# Patient Record
Sex: Female | Born: 1980 | Race: White | Hispanic: No | Marital: Single | Smoking: Never smoker
Health system: Southern US, Community
[De-identification: ages and names within clinical notes are randomized; demographics above are authoritative.]

## PROBLEM LIST (undated history)

## (undated) DIAGNOSIS — F419 Anxiety disorder, unspecified: Secondary | ICD-10-CM

## (undated) DIAGNOSIS — F329 Major depressive disorder, single episode, unspecified: Secondary | ICD-10-CM

## (undated) DIAGNOSIS — F988 Other specified behavioral and emotional disorders with onset usually occurring in childhood and adolescence: Secondary | ICD-10-CM

## (undated) DIAGNOSIS — F32A Depression, unspecified: Secondary | ICD-10-CM

## (undated) HISTORY — DX: Other specified behavioral and emotional disorders with onset usually occurring in childhood and adolescence: F98.8

## (undated) HISTORY — DX: Major depressive disorder, single episode, unspecified: F32.9

## (undated) HISTORY — DX: Depression, unspecified: F32.A

---

## 2004-12-11 DIAGNOSIS — F1590 Other stimulant use, unspecified, uncomplicated: Secondary | ICD-10-CM

## 2004-12-11 HISTORY — DX: Other stimulant use, unspecified, uncomplicated: F15.90

## 2013-05-21 ENCOUNTER — Other Ambulatory Visit: Payer: Self-pay | Admitting: Gastroenterology

## 2013-05-21 ENCOUNTER — Ambulatory Visit
Admission: RE | Admit: 2013-05-21 | Discharge: 2013-05-21 | Disposition: A | Discharge status: Home / Self Care | Payer: Medicare Other | Source: Ambulatory Visit | Attending: Gastroenterology | Admitting: Gastroenterology

## 2013-05-21 DIAGNOSIS — K5900 Constipation, unspecified: Secondary | ICD-10-CM

## 2013-08-18 ENCOUNTER — Ambulatory Visit (INDEPENDENT_AMBULATORY_CARE_PROVIDER_SITE_OTHER): Payer: Medicare Other | Admitting: Emergency Medicine

## 2013-08-18 VITALS — BP 112/60 | HR 87 | Temp 97.8°F | Resp 18 | Ht 68.5 in | Wt 153.0 lb

## 2013-08-18 DIAGNOSIS — F3289 Other specified depressive episodes: Secondary | ICD-10-CM

## 2013-08-18 DIAGNOSIS — F329 Major depressive disorder, single episode, unspecified: Secondary | ICD-10-CM

## 2013-08-18 DIAGNOSIS — F988 Other specified behavioral and emotional disorders with onset usually occurring in childhood and adolescence: Secondary | ICD-10-CM

## 2013-08-18 DIAGNOSIS — F32A Depression, unspecified: Secondary | ICD-10-CM

## 2013-08-18 MED ORDER — ARIPIPRAZOLE 2 MG PO TABS: 2.0000 mg | ORAL_TABLET | Freq: Every day | ORAL | Status: AC

## 2013-08-18 MED ORDER — AMPHETAMINE-DEXTROAMPHETAMINE 30 MG PO TABS: 30.0000 mg | ORAL_TABLET | Freq: Every day | ORAL | Status: DC

## 2013-08-18 NOTE — Progress Notes (Signed)
[facility] [address] [place] [address] [phone]  Date:  08/18/2013   Name:  Brianna Reese   DOB:  July 06, 1981   MRN:  [id number]  PCP:  No primary provider on file.    Chief Complaint: Medication Refill   History of Present Illness:  Brianna Reese is a 32 y.o. very pleasant female patient who presents with the following:  Just moved here from [place] and needs medication.  She is attending school.  Is out of medications.  Suffers chronic depression and ADD.  She provided evidence via [facility] records of her medication history.   No improvement with over the counter medications or other home remedies. Denies other complaint or health concern today.   There are no active problems to display for this patient.   Past Medical History  Diagnosis Date  . ADD (attention deficit disorder)     History reviewed. No pertinent past surgical history.  History  Substance Use Topics  . Smoking status: Never Smoker   . Smokeless tobacco: Not on file  . Alcohol Use: No    History reviewed. No pertinent family history.  No Known Allergies  Medication list has been reviewed and updated.  No current outpatient prescriptions on file prior to visit.   No current facility-administered medications on file prior to visit.    Review of Systems:  As per HPI, otherwise negative.   Physical Examination: Filed Vitals:   08/18/13 1644  BP: 112/60  Pulse: 87  Temp: 97.8 F (36.6 C)  Resp: 18   Filed Vitals:   08/18/13 1644  Height: 5' 8.5" (1.74 m)  Weight: 153 lb (69.4 kg)   Body mass index is 22.92 kg/(m^2). Ideal Body Weight: Weight in (lb) to have BMI = 25: 166.5  GEN: WDWN, NAD, Non-toxic, A & O x 3 HEENT: Atraumatic, Normocephalic. Neck supple. No masses, No LAD. Ears and Nose: No external deformity. CV: RRR, No M/G/R. No JVD. No thrill. No extra heart sounds. PULM: CTA B, no wheezes, crackles, rhonchi. No retractions. No resp. distress. No  accessory muscle use. ABD: S, NT, ND, +BS. No rebound. No HSM. EXTR: No c/c/e NEURO Normal gait.  PSYCH: Normally interactive. Conversant. Not depressed or anxious appearing.  Calm demeanor.    Assessment and Plan: ADD and depression Bring records One month supply medication Signed,  Phillips Odor, MD

## 2013-08-18 NOTE — Patient Instructions (Addendum)
Attention Deficit Hyperactivity Disorder Attention deficit hyperactivity disorder (ADHD) is a problem with behavior issues based on the way the brain functions (neurobehavioral disorder). It is a common reason for behavior and academic problems in school. CAUSES  The cause of ADHD is unknown in most cases. It may run in families. It sometimes can be associated with learning disabilities and other behavioral problems. SYMPTOMS  There are 3 types of ADHD. The 3 types and some of the symptoms include:  Inattentive  Gets bored or distracted easily.  Loses or forgets things. Forgets to hand in homework.  Has trouble organizing or completing tasks.  Difficulty staying on task.  An inability to organize daily tasks and school work.  Leaving projects, chores, or homework unfinished.  Trouble paying attention or responding to details. Careless mistakes.  Difficulty following directions. Often seems like is not listening.  Dislikes activities that require sustained attention (like chores or homework).  Hyperactive-impulsive  Feels like it is impossible to sit still or stay in a seat. Fidgeting with hands and feet.  Trouble waiting turn.  Talking too much or out of turn. Interruptive.  Speaks or acts impulsively.  Aggressive, disruptive behavior.  Constantly busy or on the go, noisy.  Combined  Has symptoms of both of the above. Often children with ADHD feel discouraged about themselves and with school. They often perform well below their abilities in school. These symptoms can cause problems in home, school, and in relationships with peers. As children get older, the excess motor activities can calm down, but the problems with paying attention and staying organized persist. Most children do not outgrow ADHD but with good treatment can learn to cope with the symptoms. DIAGNOSIS  When ADHD is suspected, the diagnosis should be made by professionals trained in ADHD.  Diagnosis will  include:  Ruling out other reasons for the child's behavior.  The caregivers will check with the child's school and check their medical records.  They will talk to teachers and parents.  Behavior rating scales for the child will be filled out by those dealing with the child on a daily basis. A diagnosis is made only after all information has been considered. TREATMENT  Treatment usually includes behavioral treatment often along with medicines. It may include stimulant medicines. The stimulant medicines decrease impulsivity and hyperactivity and increase attention. Other medicines used include antidepressants and certain blood pressure medicines. Most experts agree that treatment for ADHD should address all aspects of the child's functioning. Treatment should not be limited to the use of medicines alone. Treatment should include structured classroom management. The parents must receive education to address rewarding good behavior, discipline, and limit-setting. Tutoring or behavioral therapy or both should be available for the child. If untreated, the disorder can have long-term serious effects into adolescence and adulthood. HOME CARE INSTRUCTIONS   Often with ADHD there is a lot of frustration among the family in dealing with the illness. There is often blame and anger that is not warranted. This is a life long illness. There is no way to prevent ADHD. In many cases, because the problem affects the family as a whole, the entire family may need help. A therapist can help the family find better ways to handle the disruptive behaviors and promote change. If the child is young, most of the therapist's work is with the parents. Parents will learn techniques for coping with and improving their child's behavior. Sometimes only the child with the ADHD needs counseling. Your caregivers can help   you make these decisions.  Children with ADHD may need help in organizing. Some helpful tips include:  Keep  routines the same every day from wake-up time to bedtime. Schedule everything. This includes homework and playtime. This should include outdoor and indoor recreation. Keep the schedule on the refrigerator or a bulletin board where it is frequently seen. Mark schedule changes as far in advance as possible.  Have a place for everything and keep everything in its place. This includes clothing, backpacks, and school supplies.  Encourage writing down assignments and bringing home needed books.  Offer your child a well-balanced diet. Breakfast is especially important for school performance. Children should avoid drinks with caffeine including:  Soft drinks.  Coffee.  Tea.  However, some older children (adolescents) may find these drinks helpful in improving their attention.  Children with ADHD need consistent rules that they can understand and follow. If rules are followed, give small rewards. Children with ADHD often receive, and expect, criticism. Look for good behavior and praise it. Set realistic goals. Give clear instructions. Look for activities that can foster success and self-esteem. Make time for pleasant activities with your child. Give lots of affection.  Parents are their children's greatest advocates. Learn as much as possible about ADHD. This helps you become a stronger and better advocate for your child. It also helps you educate your child's teachers and instructors if they feel inadequate in these areas. Parent support groups are often helpful. A national group with local chapters is called CHADD (Children and Adults with Attention Deficit Hyperactivity Disorder). PROGNOSIS  There is no cure for ADHD. Children with the disorder seldom outgrow it. Many find adaptive ways to accommodate the ADHD as they mature. SEEK MEDICAL CARE IF:  Your child has repeated muscle twitches, cough or speech outbursts.  Your child has sleep problems.  Your child has a marked loss of  appetite.  Your child develops depression.  Your child has new or worsening behavioral problems.  Your child develops dizziness.  Your child has a racing heart.  Your child has stomach pains.  Your child develops headaches. Document Released: 11/17/2002 Document Revised: 02/19/2012 Document Reviewed: 06/29/2008 ExitCare Patient Information 2014 [employer].  

## 2013-08-20 ENCOUNTER — Telehealth: Payer: Self-pay | Admitting: Family Medicine

## 2013-08-20 NOTE — Telephone Encounter (Signed)
Faxed records release to family Dr. in [place]. Confirmation received. Eileen Stanford

## 2013-09-01 ENCOUNTER — Ambulatory Visit (INDEPENDENT_AMBULATORY_CARE_PROVIDER_SITE_OTHER): Payer: Medicare Other | Admitting: Internal Medicine

## 2013-09-01 VITALS — BP 110/60 | HR 82 | Temp 98.2°F | Resp 16 | Ht 69.0 in | Wt 153.8 lb

## 2013-09-01 DIAGNOSIS — Z7189 Other specified counseling: Secondary | ICD-10-CM

## 2013-09-01 DIAGNOSIS — Z7185 Encounter for immunization safety counseling: Secondary | ICD-10-CM

## 2013-09-01 NOTE — Patient Instructions (Signed)
Will follow up with immunization if not immune.

## 2013-09-01 NOTE — Progress Notes (Signed)
  Subjective:    Patient ID: Brianna Reese, female    DOB: December 15, 1980, 32 y.o.   MRN: [id number]  HPI nees to have her mmr status or immunization for a scholarship she is to receive to go back to school >unable to get records from previous immunizations.    Review of Systems  All other systems reviewed and are negative.       Objective:   Physical Exam  Nursing note and vitals reviewed. Constitutional: She is oriented to person, place, and time. She appears well-developed and well-nourished.  HENT:  Head: Normocephalic and atraumatic.  Nose: Nose normal.  Eyes: Conjunctivae and EOM are normal. Pupils are equal, round, and reactive to light.  Neck: Normal range of motion. Neck supple.  Cardiovascular: Normal rate and regular rhythm.   Pulmonary/Chest: Effort normal.  Abdominal: Soft.  Musculoskeletal: Normal range of motion.  Neurological: She is alert and oriented to person, place, and time.  Skin: Skin is warm and dry.  Psychiatric: She has a normal mood and affect. Her behavior is normal. Thought content normal.          Assessment & Plan:  Will check mmr immune titer and then immunize if necessayr

## 2013-09-01 NOTE — Addendum Note (Signed)
Addended by: Sheldon Silvan on: 09/01/2013 08:32 PM   Modules accepted: Level of Service

## 2013-09-03 ENCOUNTER — Telehealth: Payer: Self-pay

## 2013-09-03 NOTE — Telephone Encounter (Signed)
Can someone please review these for me. Thanks

## 2013-09-03 NOTE — Telephone Encounter (Signed)
Please let know that titer shows she is immune MMR and does not need a booster

## 2013-09-03 NOTE — Telephone Encounter (Signed)
LMOM for pt to call me back.

## 2013-09-03 NOTE — Telephone Encounter (Signed)
Dr Dareen Piano only gives one month until she can establish with a provider, he will not refill. She will need office visit. Will advise when she calls back about the Labs.

## 2013-09-03 NOTE — Telephone Encounter (Signed)
Pt is also needing a refill on adderall

## 2013-09-04 NOTE — Telephone Encounter (Signed)
Called her to advise her to come in to establish care Dr Dareen Piano does not do this. Left message for her to call me back.

## 2013-09-04 NOTE — Telephone Encounter (Signed)
PT REQUESTING ADDERALL REFILL    BEST PHONE [phone]

## 2013-09-04 NOTE — Telephone Encounter (Signed)
Patient advised. She states she will come in to establish care. She states she had medical records sent, have we gotten these?

## 2013-09-05 ENCOUNTER — Ambulatory Visit (INDEPENDENT_AMBULATORY_CARE_PROVIDER_SITE_OTHER): Payer: Medicare Other | Admitting: Internal Medicine

## 2013-09-05 VITALS — BP 100/62 | HR 87 | Temp 98.1°F | Resp 18 | Ht 68.5 in | Wt 155.0 lb

## 2013-09-05 DIAGNOSIS — F329 Major depressive disorder, single episode, unspecified: Secondary | ICD-10-CM

## 2013-09-05 DIAGNOSIS — F32A Depression, unspecified: Secondary | ICD-10-CM

## 2013-09-05 DIAGNOSIS — F988 Other specified behavioral and emotional disorders with onset usually occurring in childhood and adolescence: Secondary | ICD-10-CM

## 2013-09-05 MED ORDER — AMPHETAMINE-DEXTROAMPHETAMINE 30 MG PO TABS: 30.0000 mg | ORAL_TABLET | Freq: Two times a day (BID) | ORAL | Status: DC

## 2013-09-05 NOTE — Progress Notes (Signed)
  Subjective:    Patient ID: Brianna Reese, female    DOB: 12/20/1980, 32 y.o.   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 Why//was losing sense of self//Mom depressed as well/had a bad relationship No current relationship   Review of Systems No illnesses/no other medications No weight loss No chest pain or palpitations No shortness of breath No endocrine disorders No headaches    Objective:   Physical Exam BP 100/62  Pulse 87  Temp(Src) 98.1 F (36.7 C) (Oral)  Resp 18  Ht 5' 8.5" (1.74 m)  Wt 155 lb (70.308 kg)  BMI 23.22 kg/m2  SpO2 100%  LMP 08/17/2013 No thyromegaly Heart regular Neurological intact Mood good Affect appropriate Thought content normal   Discussed ADD and relationships Discussed cognitive behavioral therapy 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 Marliss Czar for counseling Meds ordered this encounter  Medications  . amphetamine-dextroamphetamine (ADDERALL) 30 MG tablet    Sig: Take 1 tablet (30 mg total) by mouth 2 (two) times daily.    Dispense:  60 tablet    Refill:  0  .  amphetamine-dextroamphetamine (ADDERALL) 30 MG tablet    Sig: Take 1 tablet (30 mg total) by mouth 2 (two) times daily.    Dispense:  60 tablet    Refill:  0         10/05/2013   . amphetamine-dextroamphetamine (ADDERALL) 30 MG tablet    Sig: Take 1 tablet (30 mg total) by mouth 2 (two) times daily.    Dispense:  60 tablet    Refill:  0        11/06/19 14   She has enough Abilify Followup 3 months

## 2013-09-07 DIAGNOSIS — F988 Other specified behavioral and emotional disorders with onset usually occurring in childhood and adolescence: Secondary | ICD-10-CM | POA: Insufficient documentation

## 2013-09-07 DIAGNOSIS — F329 Major depressive disorder, single episode, unspecified: Secondary | ICD-10-CM | POA: Insufficient documentation

## 2013-09-07 DIAGNOSIS — F32A Depression, unspecified: Secondary | ICD-10-CM | POA: Insufficient documentation

## 2013-09-10 NOTE — Progress Notes (Signed)
Appt made for 12/17 at 3:30.

## 2013-11-26 ENCOUNTER — Ambulatory Visit (INDEPENDENT_AMBULATORY_CARE_PROVIDER_SITE_OTHER): Payer: Medicare Other | Admitting: Internal Medicine

## 2013-11-26 ENCOUNTER — Encounter: Payer: Self-pay | Admitting: Internal Medicine

## 2013-11-26 VITALS — BP 108/70 | HR 98 | Temp 98.0°F | Resp 16 | Ht 68.0 in | Wt 149.8 lb

## 2013-11-26 DIAGNOSIS — F988 Other specified behavioral and emotional disorders with onset usually occurring in childhood and adolescence: Secondary | ICD-10-CM

## 2013-11-26 DIAGNOSIS — F32A Depression, unspecified: Secondary | ICD-10-CM

## 2013-11-26 DIAGNOSIS — F329 Major depressive disorder, single episode, unspecified: Secondary | ICD-10-CM

## 2013-11-26 MED ORDER — AMPHETAMINE-DEXTROAMPHETAMINE 30 MG PO TABS: 30.0000 mg | ORAL_TABLET | Freq: Two times a day (BID) | ORAL | Status: AC

## 2013-11-26 MED ORDER — AMPHETAMINE-DEXTROAMPHET ER 30 MG PO CP24: 30.0000 mg | ORAL_CAPSULE | Freq: Two times a day (BID) | ORAL | Status: AC

## 2013-11-26 NOTE — Progress Notes (Addendum)
   Subjective:    Patient ID: Brianna Reese, female    DOB: [birth year], 32 y.o.   MRN: [id number]  HPI Patient reports today for follow up of depression and ADD. Had a good semester. Feels like medications are working well. Does not wish to change anything at this time. Wishes to schedule complete physical for next visit.  Unaware of [employer] eligibility Had panic during exams//see meds list--was not talkative about this care(cloNIDine (CATAPRES) 0.1 MG tablet/clonazePAM (KLONOPIN) 0.5 MG tablet) Denies problems now   Review of Systems noncontrib    Objective:   Physical Exam  Nursing note and vitals reviewed. Constitutional: She is oriented to person, place, and time. She appears well-developed and well-nourished.  Pulmonary/Chest: Effort normal.  Neurological: She is alert and oriented to person, place, and time.  Psychiatric: She has a normal mood and affect. Her behavior is normal. Judgment and thought content normal.      Assessment & Plan:  Attention deficit disorder without mention of hyperactivity  Depression  Asked patient to request her records from [place]. Return in 3 months for complete physical exam. Gave name of A Mitchum again [facility] to be run  Meds ordered this encounter  Medications  . amphetamine-dextroamphetamine (ADDERALL) 30 MG tablet    Sig: Take 1 tablet (30 mg total) by mouth 2 (two) times daily. For 30d after signing date    Dispense:  60 tablet    Refill:  0  . amphetamine-dextroamphetamine (ADDERALL) 30 MG tablet    Sig: Take 1 tablet (30 mg total) by mouth 2 (two) times daily.    Dispense:  60 tablet    Refill:  0  . amphetamine-dextroamphetamine (ADDERALL XR) 30 MG 24 hr capsule    Sig: Take 1 capsule (30 mg total) by mouth 2 (two) times daily. For 60 days after signing date    Dispense:  60 capsule    Refill:  0    I have completed the patient encounter in its entirety as documented by FNP Leone Payor, with editing by me where  necessary. Brianna Reese P. Merla Riches, M.D.   Addendum-11/30/13 Because of her lack of attention to providing medical records I investigated the [place] control substances reporting system and discovered that she has obtained other stimulant medications at the same time has not prescriptions from at least 2 other providers: [facility]=[place] and [facility]-[place] As a result all discontinued prescribing any medications for her as it seems most appropriate that she have neuropsychiatric care

## 2013-12-01 ENCOUNTER — Telehealth: Payer: Self-pay | Admitting: Radiology

## 2013-12-01 NOTE — Telephone Encounter (Signed)
Brianna Reese is working on trying to get this cancelled.

## 2013-12-01 NOTE — Telephone Encounter (Signed)
Pharmacy opens at 9am, will call to cancel then.

## 2013-12-30 ENCOUNTER — Telehealth: Payer: Self-pay

## 2013-12-30 NOTE — Telephone Encounter (Signed)
According to Dr Doolittle's last OV it looks like he knows about her filling multiple Rx from different providers at different pharmacies he feels that she should be seen [facility].  I have called and left a message for the patient to return our call and I am happy to talk with her.

## 2013-12-30 NOTE — Telephone Encounter (Signed)
Leatrice JewelsAleisha, pharmacist at [facility], called to report pt has been getting Adderall from several different providers and multiple pharmacies and wanted to know if we were aware and still want her to fill the current Rx for pt written 11/26/13 and due to fill now. Other providers include Dr Benetta [place]ia [place]nodi in [place] and Dr Elliot Gault[place]inta Mojeed in [place]. I advised pharmacist to not fill the Rx until she hears back from us. Can someone please run the controlled subst report and advise, and we can forward info to Dr Merla Richesoolittle.

## 2014-01-01 NOTE — Telephone Encounter (Signed)
Dr Doolittle; FYI 

## 2014-01-02 NOTE — Telephone Encounter (Signed)
Call that pharmacist to cancel filling any further prescriptions from me

## 2014-01-05 NOTE — Telephone Encounter (Signed)
Called [facility] and told them, per dr doolittle's note, to not fill anymore rx's from him for this pt. They are aware.

## 2014-03-04 ENCOUNTER — Ambulatory Visit: Payer: Medicare Other | Admitting: Internal Medicine

## 2014-05-28 IMAGING — CR DG ABDOMEN 2V
2 series · 2 of 2 positions shown · non-contrast
Comparison: None.

CLINICAL DATA: Left upper quadrant pain, constipation for 2 years

ABDOMEN - 2 VIEW

[w abdomen upright]
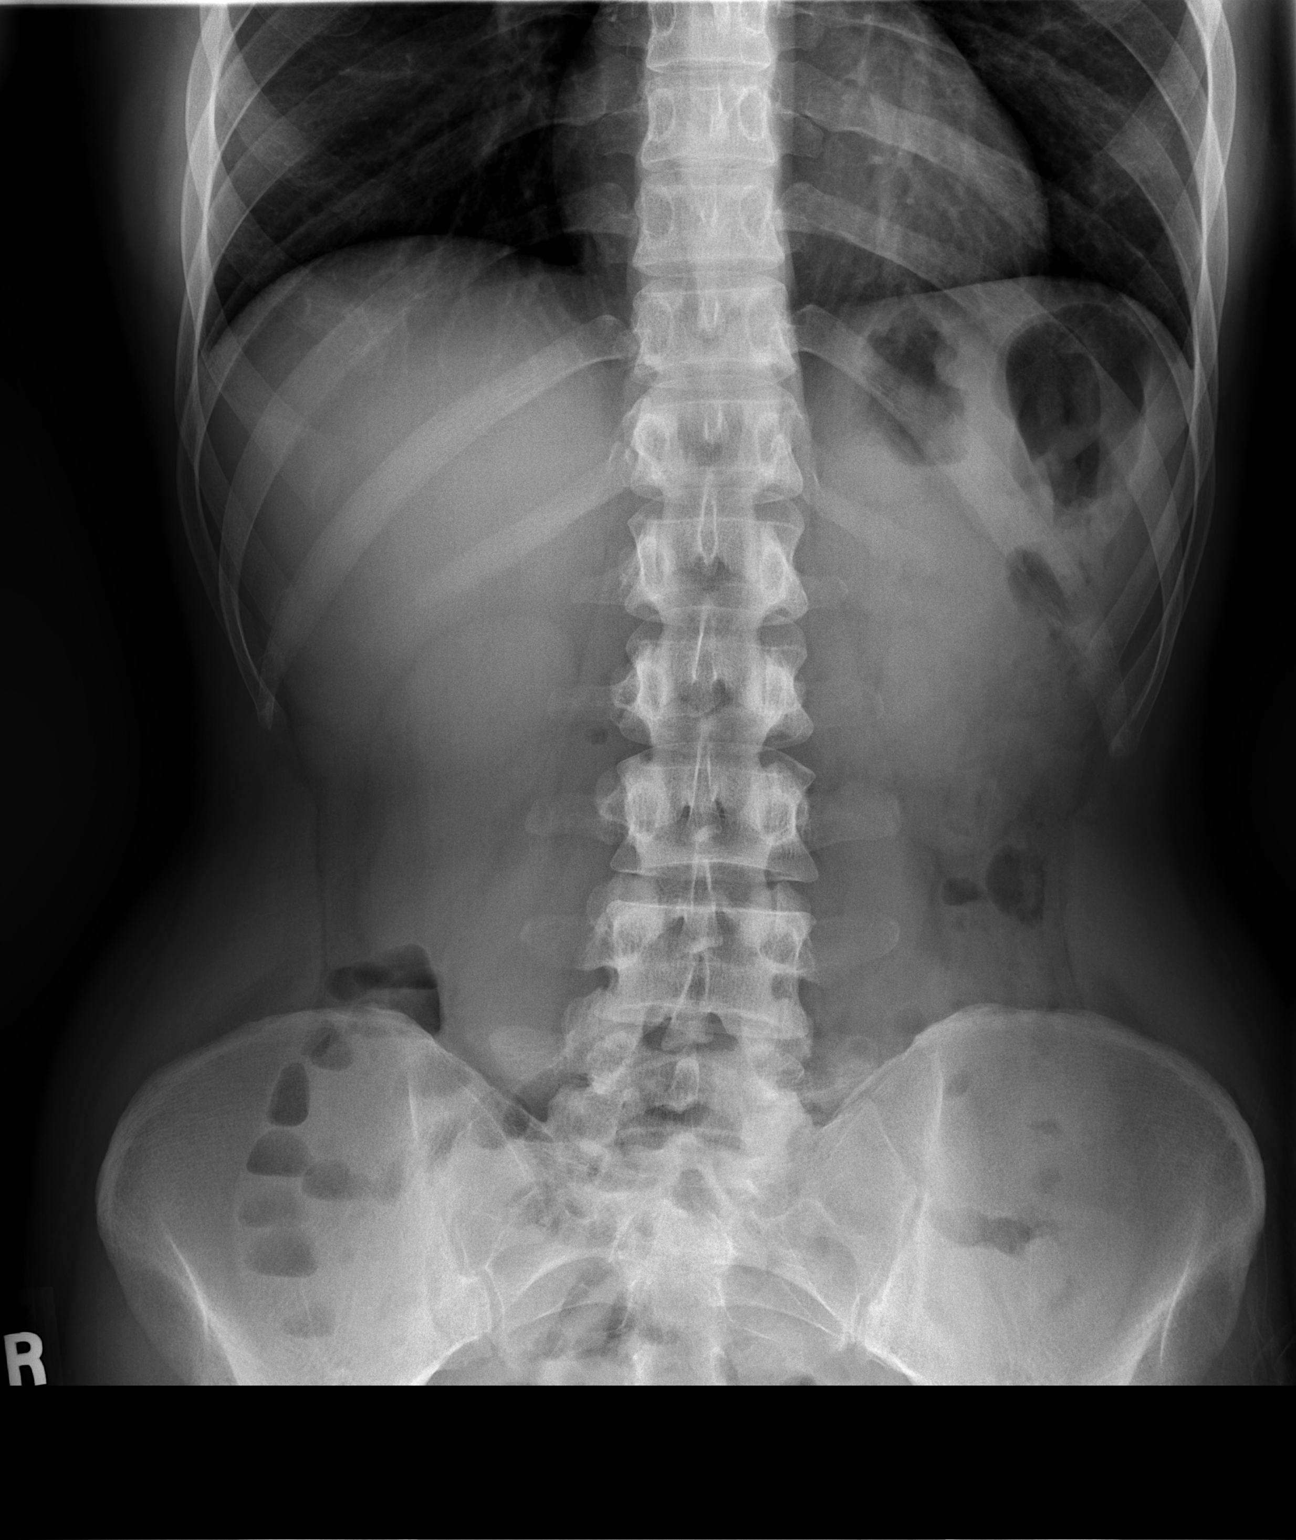

[t abdomen supine]
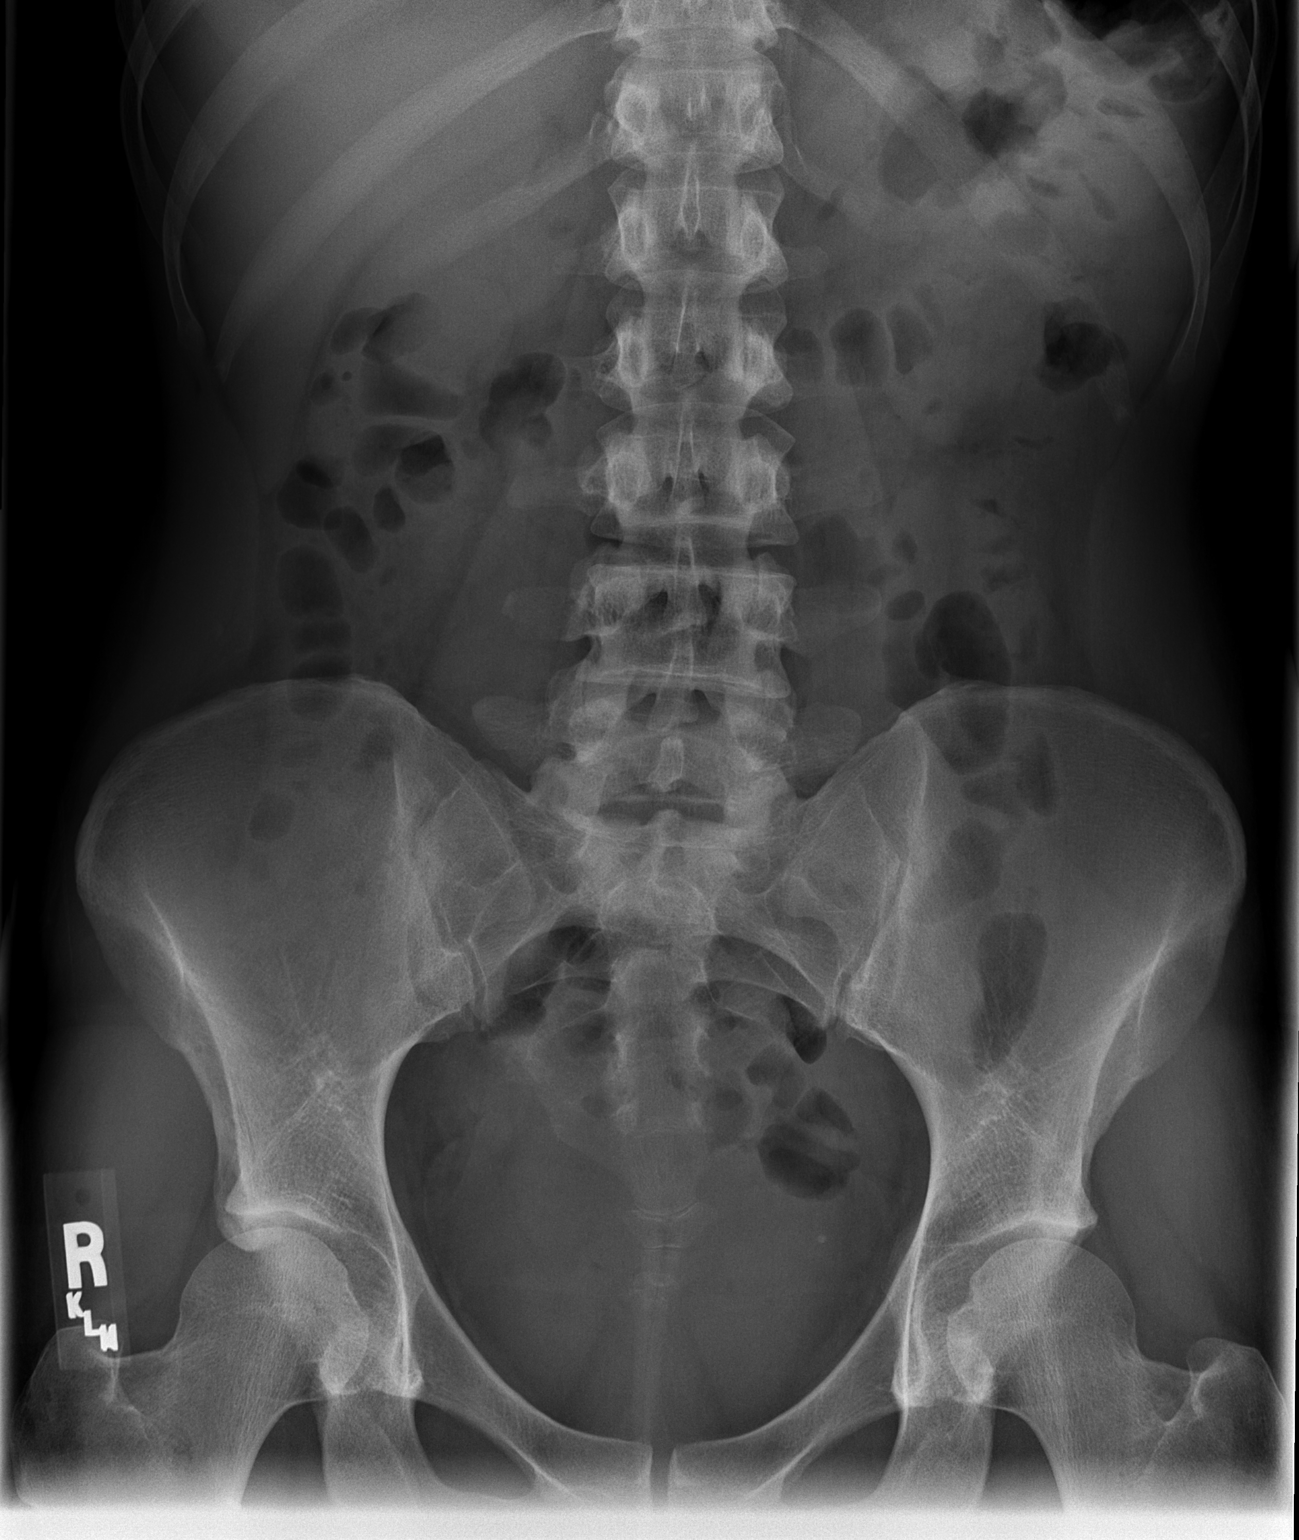

[2 of 2 positions shown; findings below may reference images not displayed]

FINDINGS: Supine and erect views of the abdomen show no bowel
obstruction.  There are a few scattered air-fluid levels on the
erect view within the right lower quadrant of questionable
significance, possibly indicating a reflex ileus as with a local
inflammatory process.  However the patient's pain by history is
primarily in the left upper quadrant, not in the right lower
quadrant.  No opaque calculi are noted.  No bony abnormality is
seen.
IMPRESSION: 1.  No bowel obstruction.  No free air.
2.  A few air-fluid levels in the right lower quadrant of
questionable significance, possibly nonspecific, but cannot exclude
a local inflammatory process.

## 2015-04-12 ENCOUNTER — Other Ambulatory Visit: Payer: Self-pay | Admitting: Emergency Medicine

## 2015-11-01 ENCOUNTER — Encounter (HOSPITAL_COMMUNITY): Payer: Self-pay

## 2015-11-01 ENCOUNTER — Emergency Department
Admission: EM | Admit: 2015-11-01 | Discharge: 2015-11-01 | Disposition: A | Discharge status: Home / Self Care | Payer: Medicare Other | Attending: Emergency Medicine | Admitting: Emergency Medicine

## 2015-11-01 ENCOUNTER — Emergency Department (HOSPITAL_COMMUNITY): Payer: Medicare Other

## 2015-11-01 DIAGNOSIS — F419 Anxiety disorder, unspecified: Secondary | ICD-10-CM | POA: Insufficient documentation

## 2015-11-01 DIAGNOSIS — Z87891 Personal history of nicotine dependence: Secondary | ICD-10-CM | POA: Insufficient documentation

## 2015-11-01 HISTORY — DX: Anxiety disorder, unspecified: F41.9

## 2015-11-01 MED ORDER — CLONAZEPAM 1 MG TABLET
1.0000 mg | ORAL_TABLET | Freq: Every day | ORAL | Status: AC
Start: 2015-11-01 — End: 2015-11-08

## 2015-11-01 MED ORDER — CLONAZEPAM 1 MG TABLET
1.00 mg | ORAL_TABLET | ORAL | Status: AC
Start: 2015-11-01 — End: 2015-11-01
  Administered 2015-11-01: 1 mg via ORAL
  Filled 2015-11-01: qty 1

## 2015-11-01 NOTE — ED Provider Notes (Signed)
Department of Emergency Medicine  HPI - 11/01/2015    Attending: Dr. Winona LegatoKiefer  Mid-Level Provider: Rob Hickmanachel Adams, APRN  Chief Complaint:   Medication refill  History of Present Illness:   Sandra Settingara Delbene, 34 y.o. female   Significant PMH: Anxiety    Sandra Hancock is a 34 y.o. female presenting to the ED via POV with c/o of medication refill. Pt reports she has taken Klonopin 1 mg once a day for 10 years for her anxiety. Pt reports she recently moved here ~3 weeks ago for school from [place] and took her last dose of Klonopin yesterday. Pt states she has set up an appointment with a PCP, but it is not until 11/08/15. Denies ever misusing Klonopin. Pt states she has not been prescribed Klonopin by any physician in [place]. Pt states she has tried weaning off of Klonopin in the past. Former smoker. Denies any other drug use. Pt is otherwise healthy. Denies CP, SOB and any other s/s.     Allergies: NKDA     History Limitations: None    Review of Systems:   Constitutional: No fever, chills or weakness, +medication refill   Skin: No rashes or diaphoresis   HENT: No headaches, congestion   Eyes: No vision changes, discharge   Cardio: No chest pain, palpitations or leg swelling    Respiratory: No cough, wheezing or SOB   GI:  No nausea, vomiting, diarrhea, constipation or abdominal pain   GU:  No dysuria, hematuria, polyuria   MSK: No joint or back pain   Neuro: No loss of sensation, focal deficits or LOC   Psych: No SI, HI or substance abuse.     All other review of systems were negative     Medications:  None       Allergies:  No Known Allergies    Past Medical History:  Past Medical History   Diagnosis Date    Anxiety          Past Surgical History:  History reviewed. No pertinent past surgical history.        Social History:  Social History     Social History    Marital Status: Significant Other     Spouse Name: N/A    Number of Children: N/A    Years of Education: N/A     Occupational History    Not on  file.     Social History Main Topics    Smoking status: Former Smoker    Smokeless tobacco: Never Used    Alcohol Use: No    Drug Use: No    Sexual Activity: Not on file     Other Topics Concern    Not on file     Social History Narrative    No narrative on file       Family History:  No family history on file.        Physical Exam:  All nurse's notes reviewed.  Filed Vitals:    11/01/15 1704   BP: 118/72   Pulse: 78   Temp: 36.1 C (97 F)   Resp: 16   SpO2: 99%        Constitutional: NAD. A+Ox3   HENT:    Head: NC AT    Mouth/Throat: Oropharynx is clear and moist.    Eyes: PERRL, EOMI, Conjunctivae without discharge   Neck: Trachea midline.    Cardiovascular: RRR, No murmurs, rubs or gallops.    Pulmonary/Chest: BS equal bilaterally, good air movement. No  respiratory distress. No wheezes, rales or chest tenderness.    Musculoskeletal: No obvious deformity, swelling,    Skin: Warm and dry. No rash, erythema, pallor or cyanosis   Psychiatric: Behavior is normal. Mood and affect congruent.     Neurological: Alert&Ox3. Grossly intact.     Labs:  No results found for this or any previous visit (from the past 24 hour(s)).    Imaging:  None Indicated          Orders Placed This Encounter    SCHEDULE FOLLOW-UP MEDICINE ([facility]) - [facility]    clonazePAM (KLONOPIN) 1 mg Oral Tablet    clonazePAM (KLONOPIN) tablet       Abnormal Lab results:  Labs Reviewed - No data to display      Plan: 34 y.o. female presenting with medication refill. Appropriate labs and imaging ordered. Medical Records reviewed.    Therapy/Procedures/Course/MDM:   Patient was vitally stable throughout visit.   Pt received Klonopin PO today in the ED clinic.   Prescriptions were written for klonopin.   Pt instructed she will only be prescribed Klonopin from the ED once and needs to follow up with a PCP.   Given referral for follow-up with [facility].   Results discussed with patient. she had improvement with initial ED  management. she was given the opportunity to ask questions.    Pt to follow up with PCP on scheduled appointment date, 11/08/15.  Impression:   Encounter Diagnosis   Name Primary?    Anxiety Yes     Disposition:  Discharged     Following the above history, physical exam, and studies, the patient was deemed stable and suitable for discharge.   she will follow up with [facility] or new PCP as scheduled.    Klonopin was prescribed.  Medication instructions were discussed with the patient/patient's family.   It was advised that the patient return to the ED with any new, concerning or worsening symptoms and follow up as directed.    The patient verbalized understanding of all instructions and had no further questions or concerns.     Follow-Up:   [facility] Emergency Department  [address]  [place] [address]  [phone]    As needed, If symptoms worsen    Internal Medicine, [facility]  [address]  [place] [address]  [phone]          Prescription:     New Prescriptions    No medications on file       No future appointments.    I am scribing for, and in the presence of, Rob Hickman, APRN, for services provided on 11/01/2015  Alexa Mat Carne         The co-signing faculty was physically present in ED and available for consultation and did participate in the care of this patient.    I personally performed the services described in this documentation, as scribed  in my presence, and it is both accurate  and complete.    Rob Hickman, APRN  Rob Hickman, APRN  11/01/2015, 17:57

## 2015-11-01 NOTE — ED Nurses Note (Signed)
1710:  Pt new to area, recently moved here and ran out of prescribed klonopin for anxiety. Needs refill for medication. Unable to get an appt until 01/07/15. Pt states she has been on klonopin for 10 years.

## 2015-11-01 NOTE — ED Nurses Note (Signed)
The patient was discharged with Rx, verbal, and written instructions from the doctors/medical providers.  The patient verbalized understanding with instructions and follow-up care.

## 2015-11-01 NOTE — Discharge Instructions (Signed)
Clonazepam tablets  What is this medicine?  CLONAZEPAM (kloe NA ze pam) is a benzodiazepine. It is used to treat certain types of seizures. It is also used to treat panic disorder.  This medicine may be used for other purposes; ask your health care provider or pharmacist if you have questions.  What should I tell my health care provider before I take this medicine?  They need to know if you have any of these conditions:  -an alcohol or drug abuse problem  -bipolar disorder, depression, psychosis or other mental health condition  -glaucoma  -kidney or liver disease  -lung or breathing disease  -myasthenia gravis  -Parkinson's disease  -porphyria  -seizures or a history of seizures  -suicidal thoughts  -an unusual or allergic reaction to clonazepam, other benzodiazepines, foods, dyes, or preservatives  -pregnant or trying to get pregnant  -breast-feeding  How should I use this medicine?  Take this medicine by mouth with a glass of water. Follow the directions on the prescription label. If it upsets your stomach, take it with food or milk. Take your medicine at regular intervals. Do not take it more often than directed. Do not stop taking or change the dose except on the advice of your doctor or health care professional.  A special MedGuide will be given to you by the pharmacist with each prescription and refill. Be sure to read this information carefully each time.  Talk to your pediatrician regarding the use of this medicine in children. Special care may be needed.  Overdosage: If you think you have taken too much of this medicine contact a poison control center or emergency room at once.  NOTE: This medicine is only for you. Do not share this medicine with others.  What if I miss a dose?  If you miss a dose, take it as soon as you can. If it is almost time for your next dose, take only that dose. Do not take double or extra doses.  What may interact with this medicine?  -herbal or dietary supplements  -medicines for  depression, anxiety, or psychotic disturbances  -medicines for fungal infections like fluconazole, itraconazole, ketoconazole, voriconazole  -medicines for HIV infection or AIDS  -medicines for sleep  -prescription pain medicines  -propantheline  -rifampin  -sevelamer  -some medicines for seizures like carbamazepine, phenobarbital, phenytoin, primidone  This list may not describe all possible interactions. Give your health care provider a list of all the medicines, herbs, non-prescription drugs, or dietary supplements you use. Also tell them if you smoke, drink alcohol, or use illegal drugs. Some items may interact with your medicine.  What should I watch for while using this medicine?  Visit your doctor or health care professional for regular checks on your progress. Your body may become dependent on this medicine. If you have been taking this medicine regularly for some time, do not suddenly stop taking it. You must gradually reduce the dose or you may get severe side effects. Ask your doctor or health care professional for advice before increasing or decreasing the dose. Even after you stop taking this medicine it can still affect your body for several days.  If you suffer from several types of seizures, this medicine may increase the chance of grand mal seizures (epilepsy). Let your doctor or health care professional know, he or she may want to prescribe an additional medicine.  You may get drowsy or dizzy. Do not drive, use machinery, or do anything that needs mental alertness until   you know how this medicine affects you. To reduce the risk of dizzy and fainting spells, do not stand or sit up quickly, especially if you are an older patient. Alcohol may increase dizziness and drowsiness. Avoid alcoholic drinks.  Do not treat yourself for coughs, colds or allergies without asking your doctor or health care professional for advice. Some ingredients can increase possible side effects.  The use of this medicine may  increase the chance of suicidal thoughts or actions. Pay special attention to how you are responding while on this medicine. Any worsening of mood, or thoughts of suicide or dying should be reported to your health care professional right away.  Women who become pregnant while using this medicine may enroll in the [place] American Antiepileptic Drug Pregnancy Registry by calling 1-888-233-2334. This registry collects information about the safety of antiepileptic drug use during pregnancy.  What side effects may I notice from receiving this medicine?  Side effects that you should report to your doctor or health care professional as soon as possible:  -allergic reactions like skin rash, itching or hives, swelling of the face, lips, or tongue  -changes in vision  -confusion  -depression  -hallucinations  -mood changes, excitability or aggressive behavior  -movement difficulty, staggering or jerky movements  -muscle cramps, weakness  -tremors  -unusual eye movements  Side effects that usually do not require medical attention (report to your doctor or health care professional if they continue or are bothersome):  -constipation or diarrhea  -difficulty sleeping, nightmares  -dizziness, drowsiness  -headache  -increased saliva from your mouth  -nausea, vomiting  This list may not describe all possible side effects. Call your doctor for medical advice about side effects. You may report side effects to FDA at 1-800-FDA-1088.  Where should I keep my medicine?  Keep out of the reach of children. This medicine can be abused. Keep your medicine in a safe place to protect it from theft. Do not share this medicine with anyone. Selling or giving away this medicine is dangerous and against the law.  Store at room temperature between 15 and 30 degrees C (59 and 86 degrees F). Protect from light. Keep container tightly closed. Throw away any unused medicine after the expiration date.  NOTE: This sheet is a summary. It may not cover all  possible information. If you have questions about this medicine, talk to your doctor, pharmacist, or health care provider.     © 2016, Elsevier/Gold Standard. (2015-03-09 14:01:43)

## 2015-11-04 NOTE — ED Attending Note (Signed)
I was physically present and directly supervised this patients care. Patient seen and examined with the MLP, Rob Hickmanachel Adams, and history and exam reviewed. Key elements in addition to and/or correction of that documentation are as follows:  Patient is a 34 y.o.  female presenting to the ED with chief complaint of medication refill.  34 year old female with past medical history significant for anxiety disorder who is prescribed clonazepam by her outpatient physician, which she takes daily, has not recently missed doses, and took her last dose this AM.  Patient is in the process of [place]oc[place] and has an appointment to establish care in 7 days.  No SI, no HI, no AH/VH, or somatic symptoms.  Patient has [facility] and was offered follow up appointment [place] [place].  In order to avoid withdrawal, 7 day supply given but it was made clear to the patient that no additional prescriptions would be given from this ED.     ROS: Otherwise negative, if commented on in the HPI.   Filed Vitals:    11/01/15 1704   BP: 118/72   Pulse: 78   Temp: 36.1 C (97 F)   Resp: 16   SpO2: 99%           Chart completed after conclusion of patient care due to time constraints of direct patient care during shift.

## 2015-11-26 ENCOUNTER — Ambulatory Visit (HOSPITAL_COMMUNITY): Payer: Self-pay

## 2015-11-26 NOTE — Telephone Encounter (Signed)
Regarding: NPV Order in system  ----- Message from [name] sent at 11/25/2015  2:13 PM EST -----  Sandra Hancock/ NPV- pt wishes to get off her anti-anxiety medications (Klonopin). Pt has an order in the system for [facility] but prefers to have a psychiatrist handle this.

## 2015-11-26 NOTE — Telephone Encounter (Signed)
Case manager called patient to schedule an intake appointment but patient did not answer.  Case manager left message asking patient to call in to schedule an appointment.  11/26/2015  Sandra Hancock, DISCHARGE PLANNER

## 2015-12-20 ENCOUNTER — Ambulatory Visit (INDEPENDENT_AMBULATORY_CARE_PROVIDER_SITE_OTHER): Payer: Medicare Other | Admitting: Registered Nurse

## 2015-12-20 VITALS — BP 122/64 | HR 98 | Wt 164.9 lb

## 2015-12-20 DIAGNOSIS — F39 Unspecified mood [affective] disorder: Secondary | ICD-10-CM

## 2015-12-20 DIAGNOSIS — F419 Anxiety disorder, unspecified: Secondary | ICD-10-CM

## 2015-12-20 DIAGNOSIS — F13288 Sedative, hypnotic or anxiolytic dependence with other sedative, hypnotic or anxiolytic-induced disorder: Secondary | ICD-10-CM

## 2015-12-20 MED ORDER — GABAPENTIN 100 MG CAPSULE
100.00 mg | ORAL_CAPSULE | Freq: Three times a day (TID) | ORAL | 0 refills | Status: DC
Start: 2015-12-20 — End: 2015-12-28

## 2015-12-20 NOTE — Progress Notes (Addendum)
12/20/15 1300   Drug Screen Results   Amphetamine (AMP) Negative   Barbiturates (BAR) Negative   Bupenorphine (BUP) Negative   Benzodiazepine (BZO) Negative   Cocaine (COC) Negative   Methamphetamine (mAMP) Negative   Methadone (MTD) Negative   Opiates (OPI) Negative   Oxycodone (OXY) Negative   Marijuana (THC) Negative   Temperature within range? yes   Observed no   Tester jennifer   Physician leech   Lot # [id number]   Expiration Date 5/18   Internal Control Valid yes   Initials jak   Low doses of Adderall and Klonopin did not meet cut off.  Lucia GaskinsLeila Leech, APRN, 12/20/2015  Behavioral Medicine and Psychiatry    urine drug screen on 12/20/15 reviewed as negative.    Cala BradfordJames H Kasarah Sitts, DO
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
## 2015-12-27 ENCOUNTER — Other Ambulatory Visit (HOSPITAL_COMMUNITY): Payer: Self-pay | Admitting: Registered Nurse

## 2015-12-28 ENCOUNTER — Other Ambulatory Visit (HOSPITAL_COMMUNITY): Payer: Self-pay | Admitting: Registered Nurse

## 2015-12-28 ENCOUNTER — Telehealth (HOSPITAL_COMMUNITY): Payer: Self-pay | Admitting: Registered Nurse

## 2015-12-28 DIAGNOSIS — F419 Anxiety disorder, unspecified: Secondary | ICD-10-CM

## 2015-12-28 DIAGNOSIS — F13288 Sedative, hypnotic or anxiolytic dependence with other sedative, hypnotic or anxiolytic-induced disorder: Secondary | ICD-10-CM

## 2015-12-28 MED ORDER — GABAPENTIN 300 MG CAPSULE
300.00 mg | ORAL_CAPSULE | Freq: Every evening | ORAL | 0 refills | Status: DC
Start: 2015-12-28 — End: 2015-12-30

## 2015-12-28 MED ORDER — GABAPENTIN 100 MG CAPSULE
100.0000 mg | ORAL_CAPSULE | Freq: Two times a day (BID) | ORAL | 0 refills | Status: DC
Start: 2015-12-28 — End: 2015-12-30

## 2015-12-28 NOTE — Telephone Encounter (Signed)
[facility]  [address] [place] [address]  [phone]        Patient name:  Sandra Hancock  MRN:  [id number]  DOB:  04-19-1981  DATE:  12/28/2015      Spoke with pt and her partner Esmond Harps on 2 separate occasions today. Pt informed me she had 9 capsules of gabapentin 100 mg left before our follow up in 2 days. She endorsed improvement with our plan except she is having initial insomnia with racing thoughts. Her partner reports great improvement in patient's mood but fears that her insomnia will lead her to using other substances as this has happened in the past. Partner also informed pt had 3 gabapentin capsules in her jean pocket as they were away from home and partner washed jeans. Partner informed me pt was embarassed to tell me this. We originally were going to double her evening dose to 200 mg with the 100 mg capsule supply she had left until our appt in 2 days. With this new information we will instead continue 100 mg in am and afternoon with her 6 capsules and start 300 mg capsule at night for 15 day supply until she starts day program. [name] is agreeable and will relay this information to pt.    Bufford Spikes NP

## 2015-12-28 NOTE — Telephone Encounter (Signed)
Pt will be refilled if appropriate during her f/u visit in 2 days

## 2015-12-30 ENCOUNTER — Ambulatory Visit (INDEPENDENT_AMBULATORY_CARE_PROVIDER_SITE_OTHER): Payer: Medicare Other | Admitting: Registered Nurse

## 2015-12-30 ENCOUNTER — Ambulatory Visit: Payer: Medicare Other | Attending: Psychiatry

## 2015-12-30 VITALS — BP 102/70 | HR 96 | Wt 164.2 lb

## 2015-12-30 DIAGNOSIS — G4700 Insomnia, unspecified: Secondary | ICD-10-CM

## 2015-12-30 DIAGNOSIS — F1510 Other stimulant abuse, uncomplicated: Secondary | ICD-10-CM

## 2015-12-30 DIAGNOSIS — F419 Anxiety disorder, unspecified: Secondary | ICD-10-CM

## 2015-12-30 DIAGNOSIS — F1594 Other stimulant use, unspecified with stimulant-induced mood disorder: Secondary | ICD-10-CM

## 2015-12-30 DIAGNOSIS — F13288 Sedative, hypnotic or anxiolytic dependence with other sedative, hypnotic or anxiolytic-induced disorder: Secondary | ICD-10-CM

## 2015-12-30 DIAGNOSIS — Z62820 Parent-biological child conflict: Secondary | ICD-10-CM

## 2015-12-30 DIAGNOSIS — F064 Anxiety disorder due to known physiological condition: Principal | ICD-10-CM | POA: Insufficient documentation

## 2015-12-30 MED ORDER — GABAPENTIN 300 MG CAPSULE
300.0000 mg | ORAL_CAPSULE | Freq: Every evening | ORAL | 0 refills | Status: DC
Start: 2015-12-30 — End: 2016-01-14

## 2015-12-30 MED ORDER — GABAPENTIN 100 MG CAPSULE
100.0000 mg | ORAL_CAPSULE | Freq: Every day | ORAL | 0 refills | Status: DC
Start: 2015-12-30 — End: 2016-01-14
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
## 2015-12-30 NOTE — Progress Notes (Addendum)
[facility] Medicine  Behavioral Medicine and Psychiatry  Progress Note    PATIENT NAME: Sandra Hancock  DATE OF BIRTH: July 25, 1981  CHART NUMBER:  [id number]  DATE OF SERVICE: 12/30/2015    Subjective:     Chief Complaint: She had concerns including Follow Up Mood Check.    Interval History: Since Kazaria's intake 1 week prior, there was a telephone encounter with her and her partner [name] in regard to improvement in mood but continued initial insomnia. [name] reported that by history, Maddalyn will take benzodiazepines to help her sleep and she fears this will happen again. We continued gabapentin at 100 mg in am and noon and increased evening dose to 300 mg for initial insomnia that appeared to be 2/2 anxiety.  Rubi presents today with her partner [name] Timara admits to taking a piece of Adderall IR to help her wake up today. She reports the 300 mg Neurontin causes a drowsy effect in the am. She takes it right before bedtime between 9-10 pm, but cannot fall asleep until 0130 and feels "drugged" the next day. [name] reports she noted aggression/assertion in Gerene's voice today and already knew she took an Aderall.   Mood is reported as "great" until today.   Anxiety triggered by parents, unemployment, and insomnia.   She is looking forward to her [facility] intake today    Social History:   Caffeine intake continues to be excessive with E drinks consumed in the evening  Mom picks her apart with critique, which contributes to anxiety  Objective:     Physical and Mental Status Exam:  Visit Vitals    BP 102/70    Pulse 96    Wt 74.5 kg (164 lb 3.9 oz)    BMI 24.25 kg/m2       Orientation: Alert and fully oriented.  Appearance: Dressed appropriately.  Well groomed and dressed.  Motor: No abnormal movements  Manner: Pleasant, cooperative  Eye contact: good  Speech: Normal in rate, volume, verbosity  Thought process: Linear, goal directed  Thought content: No suicidal or homicidal ideation or delusions  Perception: No auditory or visual  hallucinations  Mood: "irritable"  Affect: incongruent, very pleasant  Attention: Adequate for conversation  Memory: Grossly intact  Fund of knowledge: Average  Conceptual ability: Abstract  Insight: fair  Judgment: fair to poor    Studies:  UDS today +Amphetamine    Medicines:   Outpatient Prescriptions Marked as Taking for the 12/30/15 encounter (Office Visit) with Lucia Gaskins, APRN   Medication Sig    gabapentin (NEURONTIN) 100 mg Oral Capsule Take 1 Cap (100 mg total) by mouth Twice daily for 11 days    gabapentin (NEURONTIN) 300 mg Oral Capsule Take 1 Cap (300 mg total) by mouth Every night       Assessment & Plan:     Zakiah was seen today for follow up mood check. Mieko and her partner [name] report improvement in mood with gabapentin. There is mild intolerance to the 300 mg nighttime dose. She was advised to take it earlier in the evening to avoid the am drowsiness. We will continue the 100 mg dose only once daily around lunchtime. She was again advised to avoid the Adderall, which stimulates her and increases aggravation. This makes me question whether she truly has ADHD, which she was diagnosed with in the past as a child. She went into further detail about her relationship issues with her mother and her other stressors involving finding employment and insomnia. She  continues to drink excessive caffeine in the evening and was advised to decrease intake and abstain after noon. She has an intake appt with [facility] after today's appt.     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, hypnotic or anxiolytic-induced disorder (HCC)  - Continues to abstain   - Day [facility]     Anxiety disorder, unspecified  -     gabapentin (NEURONTIN) 100 mg Oral Capsule; Take 1 Cap (100 mg total) by mouth Once a day  -     gabapentin (NEURONTIN) 300 mg  Oral Capsule; Take 1 Cap (300 mg total) by mouth Every night        -      Day Program   Insomnia, unspecified type  -     gabapentin (NEURONTIN) 300 mg Oral Capsule; Take 1 Cap (300 mg total) by mouth Every night        -      Advised change to caffeine intake and to change her poor sleep hygiene habits     Caffeine abuse  - Advised to decrease intake     Relationship problem with parent  - Day [facility]    Patient was seen independently with co-signing physician available for consultation.    Lucia Gaskins, APRN  Behavioral Medicine and Psychiatry      I reviewed the clinician's note and agree with the therapeutic intervention and plan as documented.    Cala Bradford, DO

## 2015-12-30 NOTE — Progress Notes (Signed)
12/30/15 1400   Drug Screen Results   Amphetamine (AMP) Positive   Barbiturates (BAR) Negative   Bupenorphine (BUP) Negative   Benzodiazepine (BZO) Negative   Cocaine (COC) Negative   Methamphetamine (mAMP) Negative   Methadone (MTD) Negative   Opiates (OPI) Negative   Oxycodone (OXY) Negative   Marijuana (THC) Negative   Temperature within range? yes   Observed no   Tester [facility]   Physician Kermit Balo   Lot # [id number]   Expiration Date 05/18   Internal Control Valid yes   Initials LAO

## 2015-12-31 ENCOUNTER — Ambulatory Visit (INDEPENDENT_AMBULATORY_CARE_PROVIDER_SITE_OTHER): Payer: Medicare Other | Admitting: FAMILY PRACTICE

## 2016-01-05 ENCOUNTER — Encounter (HOSPITAL_COMMUNITY): Payer: Medicare Other

## 2016-01-06 ENCOUNTER — Encounter (HOSPITAL_COMMUNITY): Payer: Medicare Other

## 2016-01-07 ENCOUNTER — Other Ambulatory Visit (HOSPITAL_COMMUNITY): Payer: Self-pay | Admitting: Registered Nurse

## 2016-01-07 ENCOUNTER — Encounter (HOSPITAL_COMMUNITY): Payer: Self-pay | Admitting: Registered Nurse

## 2016-01-07 DIAGNOSIS — F419 Anxiety disorder, unspecified: Secondary | ICD-10-CM

## 2016-01-07 DIAGNOSIS — G4700 Insomnia, unspecified: Secondary | ICD-10-CM

## 2016-01-10 ENCOUNTER — Telehealth (HOSPITAL_COMMUNITY): Payer: Self-pay | Admitting: Registered Nurse

## 2016-01-11 ENCOUNTER — Encounter (HOSPITAL_COMMUNITY): Payer: Medicare Other

## 2016-01-12 ENCOUNTER — Encounter (HOSPITAL_COMMUNITY): Payer: Medicare Other

## 2016-01-13 ENCOUNTER — Encounter (HOSPITAL_COMMUNITY): Payer: Medicare Other

## 2016-01-14 ENCOUNTER — Ambulatory Visit (INDEPENDENT_AMBULATORY_CARE_PROVIDER_SITE_OTHER): Payer: Medicare Other | Admitting: Student in an Organized Health Care Education/Training Program

## 2016-01-14 DIAGNOSIS — F39 Unspecified mood [affective] disorder: Secondary | ICD-10-CM

## 2016-01-14 DIAGNOSIS — F411 Generalized anxiety disorder: Secondary | ICD-10-CM

## 2016-01-14 DIAGNOSIS — F419 Anxiety disorder, unspecified: Secondary | ICD-10-CM

## 2016-01-14 MED ORDER — GABAPENTIN 300 MG CAPSULE
300.00 mg | ORAL_CAPSULE | Freq: Every evening | ORAL | 0 refills | Status: DC
Start: 2016-01-14 — End: 2016-02-14

## 2016-01-14 MED ORDER — GABAPENTIN 100 MG CAPSULE
ORAL_CAPSULE | ORAL | 0 refills | Status: DC
Start: 2016-01-14 — End: 2016-01-27

## 2016-01-14 NOTE — Progress Notes (Deleted)
Behavioral Medicine and Psychiatry   Routine Outpatient Follow Up Visit       Sandra Hancock   [id number]  1981-07-10    DOS: 01/14/2016  CC: Psychiatry Medication Check    SUBJECTIVE:  Patient presents for continued management of        Medication(s):       Social Hx:                     Reviewed, no change.      Medical Hx:                       Reviewed, no change.     Collateral Information:  I have reviewed the following as it relates to this encounter.                                                 OBJECTIVE:  There were no vitals filed for this visit.  PHYSICAL EXAM:  General:  NAD, alert   Eyes: EOMI bilateral   Neuro: CN II-XII grossly intact, no focal deficit              Steady gait             No resting tremor    MENTAL STATUS EXAM:  Appearance: causal attire, good hygiene  Orientation: oriented to person, place, situation  Concentration: good  Memory: no gross deficits appreciated   Behavior: cooperative  Eye Contact:   Motor: no psychomotor agitation, no resting tremor noted   Mood: "Okay"  Affect: euthymic, stable, mood congruent   Thought Process: linear and goal oriented  Thought Content: denies SI  Insight: fair  Judgement: fair      CURRENT MEDICATIONS:  Medications have been reviewed/updated with patient during current encounter.     Outpatient Medications Prior to Visit:  gabapentin (NEURONTIN) 100 mg Oral Capsule Take 1 Cap (100 mg total) by mouth Once a day   gabapentin (NEURONTIN) 300 mg Oral Capsule Take 1 Cap (300 mg total) by mouth Every night     No facility-administered medications prior to visit.     ASSESSMENT:      ICD-10-CM    1. Anxiety disorder, unspecified F41.9 gabapentin (NEURONTIN) 300 mg Oral Capsule   2. Generalized anxiety disorder F41.1 gabapentin (NEURONTIN) 300 mg Oral Capsule         PLAN:    Medications      Orders  1) Labs: not currently indicated, no labs today  2) Referrals: NPT                    Therapy  1) Advised     RTC    Patient advised to call with any questions  or concerns.     Staff    Patient staffed with Dr. Hyacinth Meeker on day of encounter.       Vernell Morgans, MD  PGY3 Resident Physician   Department of Behavioral Medicine and Psychiatry  [address]  [place], [place] [address]  Phone: [phone]  Fax: [phone]

## 2016-01-18 ENCOUNTER — Encounter (HOSPITAL_COMMUNITY): Payer: Medicare Other

## 2016-01-19 ENCOUNTER — Encounter (HOSPITAL_COMMUNITY): Payer: Medicare Other

## 2016-01-20 ENCOUNTER — Encounter (HOSPITAL_COMMUNITY): Payer: Medicare Other

## 2016-01-23 ENCOUNTER — Other Ambulatory Visit: Payer: Self-pay

## 2016-01-25 ENCOUNTER — Encounter (HOSPITAL_COMMUNITY): Payer: Medicare Other

## 2016-01-26 ENCOUNTER — Encounter (HOSPITAL_COMMUNITY): Payer: Medicare Other

## 2016-01-27 ENCOUNTER — Ambulatory Visit (INDEPENDENT_AMBULATORY_CARE_PROVIDER_SITE_OTHER): Payer: Medicare Other | Admitting: Student in an Organized Health Care Education/Training Program

## 2016-01-27 ENCOUNTER — Encounter (HOSPITAL_COMMUNITY): Payer: Self-pay | Admitting: Student in an Organized Health Care Education/Training Program

## 2016-01-27 ENCOUNTER — Encounter (HOSPITAL_COMMUNITY): Payer: Medicare Other

## 2016-01-27 DIAGNOSIS — F411 Generalized anxiety disorder: Secondary | ICD-10-CM

## 2016-01-27 DIAGNOSIS — F419 Anxiety disorder, unspecified: Secondary | ICD-10-CM

## 2016-01-27 DIAGNOSIS — R4189 Other symptoms and signs involving cognitive functions and awareness: Secondary | ICD-10-CM

## 2016-01-27 DIAGNOSIS — F988 Other specified behavioral and emotional disorders with onset usually occurring in childhood and adolescence: Secondary | ICD-10-CM

## 2016-01-27 HISTORY — DX: Generalized anxiety disorder: F41.1

## 2016-01-27 MED ORDER — PAROXETINE 10 MG TABLET
5.0000 mg | ORAL_TABLET | Freq: Every day | ORAL | 0 refills | Status: DC
Start: 2016-01-27 — End: 2016-02-11

## 2016-01-27 MED ORDER — CLONAZEPAM 0.5 MG TABLET
0.25 mg | ORAL_TABLET | Freq: Every evening | ORAL | 1 refills | Status: DC
Start: 2016-01-27 — End: 2016-04-07

## 2016-01-27 MED ORDER — GABAPENTIN 100 MG CAPSULE
100.0000 mg | ORAL_CAPSULE | Freq: Three times a day (TID) | ORAL | 1 refills | Status: DC
Start: 2016-01-27 — End: 2016-02-14

## 2016-01-27 NOTE — Progress Notes (Addendum)
Behavioral Medicine and Psychiatry   Routine Outpatient Follow Up Visit       Sandra Hancock   [id number]  [birth year]    DOS: 01/27/2016  CC: Psychiatry Medication Check    SUBJECTIVE:  Patient presents for her 2nd visit with this provider.  Sandra Hancock established care at [facility] two weeks ago with this provider.  She is here today to follow up with medication adjustment and for continued diagnostic clarification.  "Thinks" neurontin is helpful with anxiety.  She is still struggling daily, however, and partner not able to notice a difference.  Expresses desire to continue taper off of Klonopin.  Currently taking 1 mg qhs.  Asks about scheduling NPT appointment.      Discusses family dynamic during childhood.  Felt she was never able to make mother happy, strives to be "good enough."  Describes distant relationship with parents and siblings.        Social Hx: Lives in a home with partner of 5 years, Sandra Hancock.                     Homemaker                     Denies legal issues    Medical Hx: none                       Reviewed, no change.     Collateral Information:  I have reviewed the following as it relates to this encounter.                                            none    OBJECTIVE:  There were no vitals filed for this visit.    PHYSICAL EXAM:  General:  NAD, alert   Eyes: EOMI bilateral   Neuro: CN II-XII grossly intact, no focal deficit              Steady gait             No resting tremor in BUE    MENTAL STATUS EXAM:  Appearance: causal attire, good hygeine  Behavior: Cooperative and pleasant  Eye Contact: appropriate  Speech: normal rate and volume, able to interrupt  Motor: normal; no psychomotor agitation or slowing   Mood: "I'm okay"  Affect: euthymic, mood congruent, stable  Thought Process: linear and goal oriented  Thought Content: no grandiose delusions, no paranoia  Perception: not responding to internal stimuli  Memory: grossly intact  Attention: good   Abstraction: good  Fund of information: good   Insight:  good   Judgement: good       CURRENT MEDICATIONS:  Medications have been reviewed/updated with patient during current encounter.     Outpatient Medications Prior to Visit:  gabapentin (NEURONTIN) 100 mg Oral Capsule Take 100 mg twice a day, 1 pill at 8am, 1 pill at noon.Take this daily. May take an additional 2 pills prn   gabapentin (NEURONTIN) 300 mg Oral Capsule Take 1 Cap (300 mg total) by mouth Every night     Previous Medication Trials:   Seroquel- felt sedated  Depakote-felt sedated  Lamictal- does not remember   Adderall- rx for 'ADD' Misused in past    ASSESSMENT: 35 y.o. Female with Social Anxiety Disorder who is currently stable on Neurontin 100 mg in morning  and at noon with 300 mg at night.  She is currently tapering off Klonopin (used for past 8 years) and is on 1 mg dose at night.   Hx of substance use including stimulants as DOC. Previous dx of ADD, however abused the Adderall.      NPT scheduled on 2/21 to assist with diagnostic clarification.  Will continue Neurontin, however do not want to make any changes until obtain NPT. Hopeful this will help clarify which deficits are of most significance, ie memory, concentration, attention, overall cognitive ability, and etc. Multiple confounding factors include hx of trauma, substance use, family discord/support lacking in childhood, possible organic attention deficits and anxiety.      ICD-10-CM    1. ADD (attention deficit disorder) F98.8    2. Cognitive deficits R41.89 AMB CONSULT/REFERRAL NEUROPSYCH TESTING   3. Anxiety disorder, unspecified F41.9 gabapentin (NEURONTIN) 100 mg Oral Capsule       PLAN:    Medications  1) Neurontin 100 mg morning and noon.  300 mg at night.  2) Klonopin decrease to 0.5 mg at night.  3) Paxil 10 mg daily -start for anxiety, short half-life, if doesn't tolerate will change SSRI.     Orders  1) Labs: not currently indicated, no labs today  2) Referrals: NPT appointment 2/21.                    Therapy  1) Recommend therapy  after NPT.  Do not want to overwhelm patient at current time.     RTC  2-3 weeks to discuss NPT.    Patient advised to call with any questions or concerns.     Staff    Patient staffed with Dr. Sherrilee Gilles on day of encounter.       Vernell Morgans, MD  PGY3 Resident Physician   Department of Behavioral Medicine and Psychiatry  [address]  [place], [place] [address]  Phone: [phone]  Fax: [phone]        I saw and examined the patient on DOS.  I reviewed the resident's note.  I agree with the findings and plan of care as documented in the resident's note.  Any exceptions/additions are edited/noted.    Kingsley Spittle, MD

## 2016-01-28 ENCOUNTER — Ambulatory Visit (HOSPITAL_COMMUNITY): Payer: Self-pay | Admitting: Student in an Organized Health Care Education/Training Program
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
## 2016-02-01 ENCOUNTER — Ambulatory Visit
Admission: RE | Admit: 2016-02-01 | Discharge: 2016-02-01 | Disposition: A | Discharge status: Home / Self Care | Payer: Medicare Other | Source: Ambulatory Visit | Attending: Clinical Neuropsychologist | Admitting: Clinical Neuropsychologist

## 2016-02-01 ENCOUNTER — Encounter (HOSPITAL_COMMUNITY): Payer: Medicare Other

## 2016-02-01 DIAGNOSIS — R4189 Other symptoms and signs involving cognitive functions and awareness: Secondary | ICD-10-CM | POA: Insufficient documentation

## 2016-02-01 DIAGNOSIS — F419 Anxiety disorder, unspecified: Secondary | ICD-10-CM

## 2016-02-02 ENCOUNTER — Encounter (HOSPITAL_COMMUNITY): Payer: Medicare Other

## 2016-02-03 ENCOUNTER — Encounter (HOSPITAL_COMMUNITY): Payer: Medicare Other

## 2016-02-04 ENCOUNTER — Ambulatory Visit (HOSPITAL_COMMUNITY): Payer: Self-pay | Admitting: Student in an Organized Health Care Education/Training Program

## 2016-02-04 NOTE — Telephone Encounter (Signed)
-----   Message from Sol Passer sent at 01/28/2016  8:46 AM EST -----  Hi   Pt pharmacy called stating you called in this medication yesterday but they wanted to let you know that the Pt picked up this same medication with a higher strength at another pharmacy across town and would like to know if you are aware of that. Please advise   clonazePAM (KLONOPIN) 0.5 mg Oral Tablet    Preferred Pharmacy     [phone] - [address], [place] - [address]    [address] [place] [address]    Phone: [phone] Fax: [phone]    Open 24 hours      Thank You   Sol Passer  01/28/2016, 08:45

## 2016-02-04 NOTE — Telephone Encounter (Signed)
Will discuss with patient.  Thank you.  Vernell Morgans, MD  02/04/2016, 10:46

## 2016-02-04 NOTE — Telephone Encounter (Signed)
     Message from Sol Passer sent at 01/28/2016 8:46 AM EST      Hi   Pt pharmacy called stating you called in this medication yesterday but they wanted to let you know that the Pt picked up this same medication with a higher strength at another pharmacy across town and would like to know if you are aware of that. Please advise   clonazePAM (KLONOPIN) 0.5 mg Oral Tablet    Preferred Pharmacy   [facility] [phone] [address] [place] - [address]   [address] [place] [address]   Phone: [phone] Fax: [phone]   Open 24 hours     Thank You   Sol Passer 01/28/2016, 08:45         Received the above message.  Will discuss with patient.  Vernell Morgans, MD  02/04/2016, 10:46
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
## 2016-02-08 ENCOUNTER — Encounter (HOSPITAL_COMMUNITY): Payer: Medicare Other

## 2016-02-10 ENCOUNTER — Ambulatory Visit (INDEPENDENT_AMBULATORY_CARE_PROVIDER_SITE_OTHER): Payer: Medicare Other | Admitting: Student in an Organized Health Care Education/Training Program

## 2016-02-10 DIAGNOSIS — F4010 Social phobia, unspecified: Principal | ICD-10-CM

## 2016-02-11 ENCOUNTER — Encounter (HOSPITAL_COMMUNITY): Payer: Self-pay | Admitting: Student in an Organized Health Care Education/Training Program

## 2016-02-11 ENCOUNTER — Ambulatory Visit (HOSPITAL_COMMUNITY): Payer: Self-pay | Admitting: Student in an Organized Health Care Education/Training Program

## 2016-02-11 MED ORDER — FLUOXETINE 10 MG TABLET
10.0000 mg | ORAL_TABLET | Freq: Every day | ORAL | 0 refills | Status: DC
Start: 2016-02-11 — End: 2016-03-03

## 2016-02-11 NOTE — Telephone Encounter (Signed)
Regarding: rx  ----- Message from [name] sent at 02/10/2016  4:07 PM EST -----  Dr Delford Fieldash- pt says that you were to rescribe Prozac but the rx has not yet been received at the pharmacy. She asks for this to be sent to Preferred Pharmacy     [phone] [address], [place] - [address]    [address] [place] [id number]    Phone: [phone] Fax: [phone]    Open 24 hours    thanks!

## 2016-02-11 NOTE — Telephone Encounter (Signed)
Triage received.  Prozac 10 mg daily Erx to patient's pharmacy.  MyChart message sent to patient.  Vernell MorgansSarah Rembert Browe, MD  02/11/2016, 17:08

## 2016-02-11 NOTE — Telephone Encounter (Signed)
Triage received.  Prozac 10 mg daily Erx to patient's pharmacy.  Message sent to patient on MyChart.  Vernell MorgansSarah Izabellah Dadisman, MD  02/11/2016, 17:07

## 2016-02-14 ENCOUNTER — Other Ambulatory Visit (HOSPITAL_COMMUNITY): Payer: Self-pay | Admitting: Student in an Organized Health Care Education/Training Program

## 2016-02-14 ENCOUNTER — Encounter (HOSPITAL_COMMUNITY): Payer: Self-pay | Admitting: Student in an Organized Health Care Education/Training Program

## 2016-02-14 DIAGNOSIS — F39 Unspecified mood [affective] disorder: Secondary | ICD-10-CM

## 2016-02-14 DIAGNOSIS — F419 Anxiety disorder, unspecified: Secondary | ICD-10-CM

## 2016-02-14 DIAGNOSIS — F411 Generalized anxiety disorder: Secondary | ICD-10-CM

## 2016-02-14 MED ORDER — GABAPENTIN 100 MG CAPSULE
100.00 mg | ORAL_CAPSULE | Freq: Two times a day (BID) | ORAL | 1 refills | Status: DC
Start: 2016-02-14 — End: 2016-03-03

## 2016-02-14 MED ORDER — GABAPENTIN 300 MG CAPSULE
300.0000 mg | ORAL_CAPSULE | Freq: Every evening | ORAL | 1 refills | Status: DC
Start: 2016-02-14 — End: 2016-03-07

## 2016-02-14 NOTE — Telephone Encounter (Signed)
Received refill request for refill on Neurontin.  Erx sent to patient's pharmacy.    Vernell MorgansSarah Marchelle Rinella, MD  02/14/2016, 13:15]

## 2016-02-16 ENCOUNTER — Encounter (HOSPITAL_COMMUNITY): Payer: Self-pay | Admitting: Student in an Organized Health Care Education/Training Program

## 2016-02-17 ENCOUNTER — Encounter (HOSPITAL_COMMUNITY): Payer: Medicare Other | Admitting: Clinical Neuropsychologist

## 2016-02-17 ENCOUNTER — Telehealth (HOSPITAL_COMMUNITY): Payer: Self-pay | Admitting: Student in an Organized Health Care Education/Training Program

## 2016-02-17 NOTE — Telephone Encounter (Signed)
Opened in error.  Vernell MorgansSarah Lawayne Hartig, MD  02/17/2016, 16:31

## 2016-02-23 NOTE — Progress Notes (Addendum)
Behavioral Medicine and Psychiatry   Routine Outpatient Follow Up Visit       Sandra Hancock   [id number]  Feb 26, 1981    DOS: 02/10/2016  CC: Psychiatry Medication Check    SUBJECTIVE:  Patient presents for her 3rd visit with this provider.  Sandra Hancock is currently being seen for treatment of severe SAD.  She recently had NPT for clarification of reported symptoms.  Presentation and NPT effectively ruled out diagnosis of ADHD.  Patient's level of anxiety was sited as a primary factor contributing to her current cognitive level of function.  Results were discussed with patient.      She has been taking Paxil for the last few days, however notices heart rate fluctuation and nausea associated with medication.  She does think anxiety has slightly improved.  Neurontin continues to be helpful for anxiety also.     Describes relationship expectations from partner.     Social Hx: Lives in a home with partner of 5 years, [name]                     Homemaker                     Denies legal issues    Medical Hx: none                       Reviewed, no change.     Collateral Information:  I have reviewed the following as it relates to this encounter.                                            02/01/2016 Neuro Psych Testing by Dr. Beverely Pace     OBJECTIVE:  There were no vitals filed for this visit.    PHYSICAL EXAM:  General:  NAD, alert   Eyes: EOMI bilateral   Neuro: CN II-XII grossly intact, no focal deficit              Steady gait             No resting tremor in BUE    MENTAL STATUS EXAM:  Appearance: causal attire, good hygeine  Behavior: Cooperative and pleasant  Eye Contact: appropriate  Speech: normal rate and volume, able to interrupt  Motor: fidgety   Mood: "Good"  Affect: euthymic, mood congruent, stable  Thought Process: linear and goal oriented  Thought Content: no grandiose delusions, no paranoia  Perception: not responding to internal stimuli  Memory: grossly intact  Attention: good   Abstraction: good  Fund of  information: good   Insight: good   Judgement: good       CURRENT OUTPATIENT MEDICATIONS:    clonazePAM (KLONOPIN) 0.5 mg Oral Tablet, Take 1 Tabs by mouth Every night, Disp: 15 Tab, Rfl: 1     Paxil 10 mg Take 1/2 tablet daily     gabapentin (NEURONTIN) 100 mg Oral Capsule, Take 1 Cap (100 mg total) by mouth Twice daily - in morning and noon, Disp: 60 Cap, Rfl: 1    gabapentin (NEURONTIN) 300 mg Oral Capsule, Take 1 Cap (300 mg total) by mouth Every night, Disp: 30 Cap, Rfl: 1    Previous Medication Trials:   Seroquel- felt sedated  Depakote-felt sedated  Lamictal- does not remember   Adderall- rx for 'ADD' Misused in past  ASSESSMENT: 35 y.o. 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  She is currently tapering off Klonopin (used for past 8 years) and is on 1 mg dose at night.   Hx of substance use including stimulants as DOC. Previous dx of ADD, however NPT and clinical hx do not support diagnosis.    NPT was helpful identify anxiety as most significant contributing factory for difficulty with concentration, attention, overall cognitive ability, and etc. Multiple confounding factors include hx of trauma, previous substance use, family discord/support lacking in childhood, and a significant level of anxiety.    Patient is conscious of body symptoms, ie heart rate, nausea, and etc.  Stop Paxil.  Will start Prozac 10 mg daily.  Long term goal is to taper off Klonopin. However will continue Klonopin 0.5 mg dose and re-assess taper at next appointment.       ICD-10-CM    1. Social anxiety disorder F40.10        PLAN:    Medications  1) Stop Paxil.  2) Start Prozac 10 mg daily    3) Klonopin continue 0.5 mg at night.  4) Neurontin 100 mg morning and noon.  300 mg at night.    Orders  1) Labs: not currently indicated, no labs today  2) Referrals: none                     Therapy  1) Recommend CBT.      RTC  2-3 weeks for medication  check.   Patient advised to call with any questions or concerns.     Staff    Patient staffed with Dr. Vernell LeepPradhan on day of encounter.       Vernell MorgansSarah Cash, MD  PGY3 Resident Physician   Department of Behavioral Medicine and Psychiatry  [address]  [place], [place] [id number]  Phone: [phone]  Fax: [phone]        Late entry for 02/10/2016.  I saw and examined the patient.  I reviewed the resident's note.  I agree with the findings and plan of care as documented in the resident's note.  Any exceptions/additions are edited/noted.  Ara Kussmaulaniya Axzel Rockhill, MD  02/24/2016, 15:11

## 2016-02-24 ENCOUNTER — Encounter (HOSPITAL_COMMUNITY): Payer: Self-pay | Admitting: Student in an Organized Health Care Education/Training Program

## 2016-02-25 ENCOUNTER — Telehealth (HOSPITAL_COMMUNITY): Payer: Self-pay | Admitting: Student in an Organized Health Care Education/Training Program

## 2016-02-25 NOTE — Telephone Encounter (Signed)
Patient Called in and stated she cannot make an appointment today at 4 pm due to [place] in [place] the weekend. Case Manager sent Dr. Delford Fieldash response informing her of the situation.   Sandra Hancock, CASE MANAGER  02/25/2016, 14:40

## 2016-02-25 NOTE — Telephone Encounter (Signed)
-----   Message from Vernell MorgansSarah Cash, MD sent at 02/25/2016  2:25 PM EDT -----  Regarding: can you call a patient?   Can you call this patient and see if she wants to come in today at 4 pm for an appointment?      I received a message from her and could see her then?    Let me know,  Vernell MorgansSarah Cash, MD  02/25/2016, 14:27

## 2016-03-03 ENCOUNTER — Ambulatory Visit (INDEPENDENT_AMBULATORY_CARE_PROVIDER_SITE_OTHER): Payer: Medicare Other | Admitting: Student in an Organized Health Care Education/Training Program

## 2016-03-03 DIAGNOSIS — F430 Acute stress reaction: Secondary | ICD-10-CM

## 2016-03-03 DIAGNOSIS — F439 Reaction to severe stress, unspecified: Secondary | ICD-10-CM

## 2016-03-03 DIAGNOSIS — F4010 Social phobia, unspecified: Secondary | ICD-10-CM

## 2016-03-03 DIAGNOSIS — Z658 Other specified problems related to psychosocial circumstances: Secondary | ICD-10-CM

## 2016-03-03 MED ORDER — FLUOXETINE 20 MG TABLET
20.0000 mg | ORAL_TABLET | Freq: Every day | ORAL | 1 refills | Status: DC
Start: 2016-03-03 — End: 2016-03-16

## 2016-03-03 MED ORDER — GABAPENTIN 100 MG CAPSULE
ORAL_CAPSULE | ORAL | 0 refills | Status: DC
Start: 2016-03-03 — End: 2016-03-07

## 2016-03-03 NOTE — Progress Notes (Addendum)
Behavioral Medicine and Psychiatry   Routine Outpatient Follow Up Visit       Sandra Hancock   [id number]  1981/11/09    DOS: 03/03/2016  CC: Psychiatry Medication Check    SUBJECTIVE:  Patient presents for continued management of severe SAD, with hx of significant substance use disorder (stimulants were DOC).  She has been taking Prozac 10 mg daily and Neurontin 100 mg in the morning and noon, then takes 300 mg of Neurontin at night.  Continues to endorse significant anxiety, however denies panic attack.  Brought a list of 'anxiety and stuff' that her and partner [name] wrote, stated partner wanted to give input.  Continues to endorse significant anxiety regarding current relationship, lack of job/career, medications, sleep, and difficulties with memory.  Not sure if notices any benefit with Prozac, reports she has missed a few doses because she "forgot."  Previous dx of ADD, however NPT and clinical hx do not support diagnosis.  She attributes being 'forgetful' to ADD.  Writer again discussed results of NPT, and provided education regarding anxiety and it's effect on working memory.  Patient verbalized understanding, however at last encounter she reported understanding this concept (despite continuing to mention ADD).     She reports compliance with Klonopin taper.  At previous encounter was taking 1 mg daily,  instructed to decrease to 0.5 mg at night.  States she is tolerating the decrease in Klonopin and thinks the Neurontin is also helpful.  On 0.5 mg dose for past few days.      Social Hx: reviewed, no change                     Lives in a home with partner of 5 years, [name]                     Homemaker                     Denies legal issues    Medical Hx: none    Collateral Information:  I have again reviewed the following test with patient during encounter.                                            02/01/2016 Neuro Psych Testing by Dr. Beverely Pace     OBJECTIVE:  There were no vitals filed for this visit.    PHYSICAL  EXAM:  General:  NAD, alert   Eyes: EOMI bilateral   Neuro: CN II-XII grossly intact, no focal deficit              Steady gait             No resting tremor in BUE    MENTAL STATUS EXAM:  Appearance: causal attire, good hygeine  Behavior: Cooperative  Eye Contact: appropriate  Speech: normal rate and volume, able to interrupt  Motor: fidgety   Mood: "okay"  Affect: anxious/worried, incongruent with mood, stable  Thought Process: linear and goal oriented  Thought Content: no grandiose delusions, no paranoia  Perception: not responding to internal stimuli  Memory: grossly intact  Attention: good   Abstraction: good  Fund of information: good   Insight: poor-fair  Judgement: fair      Previous Medication Trials:   Seroquel- felt sedated  Depakote-felt sedated  Lamictal- does not remember   Adderall-  rx for 'ADD' Misused in past    Current Medications:   Prozac 10 mg PO daily   Neurontin 100 mg daily at morning and noon  Neurontin 300 mg daily at bedtime  Klonopin 0.5 mg at night     ASSESSMENT: 35 y.o. Female with Social Anxiety Disorder who continues to endorse significant anxiety affecting working memory.  Overall quality of life is improved since start of treatment, however she continues to requires better symptom control.  Reports compliance on Neurontin, Prozac, and also with Klonopin taper.  Inconsistencies with compliance as she also reports missed doses.   Current Klonopin (used for past 8 years) taper is at 0.5 mg dose at night for less than 1 week.  Continue this dose for 1 month, then will decrease to 0.25 mg.   Long term goal is taper off Klonopin.         Patient is conscious of body symptoms, ie heart rate/nausea.  Stopped Paxil at last appointment and initiated Prozac.  Tolerating Prozac well, resistent to dose increase.   Continue Prozac 10 mg daily.      After staffing with attending, will increase Neurontin to 300 mg TID for anxiety.      Hx of substance use including stimulants as DOC. Previous dx  of ADD, however NPT and clinical hx do not support diagnosis. NPT was helpful to identify anxiety as most significant contributing factory for difficulty with concentration, attention, and overall cognitive ability. Multiple confounding factors include hx of trauma, previous substance use, and family discord/lack of support  in childhood.  Also identifies current relationship as stressful.          ICD-10-CM    1. Social anxiety disorder F40.10 AMB CONSULT/REFERRAL PSYCHOTHERAPY   2. Interpersonal problem Z65.8 AMB CONSULT/REFERRAL PSYCHOTHERAPY   3. Ineffective individual coping F43.0 AMB CONSULT/REFERRAL PSYCHOTHERAPY       PLAN:    Medications  1) Continue Prozac 10 mg daily    2) Still on Klonopin 0.50 mg dose at night.  Plan to decrease to 0.25 mg in one month.   3) Increase Neurontin to 300 mg TID    Orders  1) Labs: not currently indicated, no labs today  2) Referrals: psychotherapy                    Therapy  1) Recommend CBT, then IPT, then couples therapy.     RTC  3-4 weeks for medication check.   Patient advised to call with any questions or concerns.     Staff    Patient staffed with Dr. Norina Buzzardhar on day of encounter.       Vernell MorgansSarah Cash, MD  PGY3 Resident Physician   Department of Behavioral Medicine and Psychiatry  [address]  [place] [id number]  Phone: [phone]  Fax: [phone]      Late entry for 03/03/2016.  I saw and examined the patient.  I reviewed the resident's note.  I agree with the findings and plan of care as documented in the resident's note.  Any exceptions/additions are edited/noted.  Ara Kussmaulaniya Brasen Bundren, MD  03/20/2016, 11:58

## 2016-03-05 ENCOUNTER — Encounter (HOSPITAL_COMMUNITY): Payer: Self-pay | Admitting: Student in an Organized Health Care Education/Training Program

## 2016-03-06 ENCOUNTER — Telehealth (HOSPITAL_COMMUNITY): Payer: Self-pay | Admitting: Student in an Organized Health Care Education/Training Program

## 2016-03-06 ENCOUNTER — Encounter (HOSPITAL_COMMUNITY): Payer: Self-pay | Admitting: Student in an Organized Health Care Education/Training Program

## 2016-03-07 MED ORDER — GABAPENTIN 300 MG CAPSULE
300.00 mg | ORAL_CAPSULE | Freq: Three times a day (TID) | ORAL | 0 refills | Status: DC
Start: 2016-03-07 — End: 2016-04-01

## 2016-03-07 NOTE — Telephone Encounter (Signed)
Late entry documentation for 03/06/2016.         Writer called and spoke to pharmacist, before contacting patient.     -Pharmacist had received the prescription for Neurontin take #1-2 tablets (100 mg) in morning and afternoon, with 300 mg at night.    Per pharmacist, patient 'should still have Neurontin left, even after dose increase.'          Writer then called and spoke with patient.    -Patient reports the weekend was stressful, which worsened her anxiety.  She describes feeling overwhelmed, "I think maybe I took more of the Neurontin, I don't know."    -Writer advised patient to take medication as prescribed, and to not take extra doses. Education provided regarding Anxiety Disorder in the context of tapering off of benzodiazepines.  Explained she may experience heightened anxiety at times, especially while tapering off Klonopin.  It was advised she not take more Neurontin than prescribed, and to remain on current taper dose of Klonopin.  -Therapy appointment has been scheduled, CBT recommended (also discussed biofeedback at previous visit.)     -There was a pause, patient started to speak, then said she 'shouldn't ask me that.'  Stated she wanted to ask me things that she 'probably shouldn't.'  -Writer explained I am her physician and personal information will not be disclosed, should not be asked, and will not be discussed.  Patient verbalized understanding. Writer then offered to transfer care to another provider, and patient stated she felt that would be a good idea.    -Writer informed patient that Neurontin 300 mg TID (also discussed at last encounter) will be sent to her pharmacy.  Advised she not take more than prescribed.  Stated again Clinical research associatewriter will transfer patient's care tomorrow when able to inform clinic manager.     -Patient had no further questions.      Sandra MorgansSarah Tishawn Friedhoff, MD  03/07/2016, 12:24

## 2016-03-08 ENCOUNTER — Encounter (HOSPITAL_COMMUNITY): Payer: Self-pay | Admitting: Student in an Organized Health Care Education/Training Program

## 2016-03-09 NOTE — Telephone Encounter (Signed)
Message received.    Writer called and discussed medications with patient.     Sandra MorgansSarah Nishawn Rotan, MD  03/09/2016, 10:25

## 2016-03-09 NOTE — Telephone Encounter (Signed)
-----   Message from [place] CityBetheny Morgan sent at 03/06/2016 12:15 PM EDT -----  Hello    The pt is calling asking to speak with you regarding her medication. Please advise     Current Outpatient Prescriptions:  gabapentin (NEURONTIN) 100 mg Oral Capsule, Take #1-2 capsules in the morning and at noon.  gabapentin (NEURONTIN) 300 mg Oral Capsule, Take 1 Cap (300 mg total) by mouth Every night    Preferred Pharmacy     [facility] [phone] [address], [place] [address]    [id number] [place] [id number]    Phone: [phone] Fax: [phone]    Open 24 hours      Thank you

## 2016-03-11 ENCOUNTER — Encounter (HOSPITAL_COMMUNITY): Payer: Self-pay | Admitting: Student in an Organized Health Care Education/Training Program

## 2016-03-13 ENCOUNTER — Telehealth (HOSPITAL_COMMUNITY): Payer: Self-pay | Admitting: Student in an Organized Health Care Education/Training Program

## 2016-03-13 NOTE — Telephone Encounter (Signed)
Documentation regarding content of telephone conversation on 03/13/2016.    Patient has been in contact with Clinical research associatewriter via [employer] messaging, and previous phone conversations.  Writer has explained the treatment plan at length. Patient continues to express anxiety regarding medications/side effects, and has difficulty understanding treatment plans.  Previous education regarding SSRI's, benzodiazepines, and Neurontin may have not been discussed with patient.  The patient's level of anxiety effects her concentration/focus, therefore working memory perhaps during discussions may not be functioning well.  The latter was also noted during NPT and is described in their report.      A significant amount of time has been allotted to provide this patient with education regarding:   1) Patient's psychiatric diagnosis and the nature of the illness   2)  Possible ways current symptoms effects one's level of functioning/processing   3) The effects of long term BZD use on the brain   4)  The effect of real and perceived stress on the brain including worsening of symptoms    5) Medication options to treat anxiety in the specific setting of        substance use disorder/ physiological dependency   6) The importance of therapy in long term management of symptoms   7)  Different types of therapy recommended and their indication for anxiety disorders   8) Any other topics as noted in previous documentation.         Patient verbalized understanding the difference in how SSRI's/Neurontin/Klonopin work in the body.  Patient informed Clinical research associatewriter she has not been taking Prozac for the last several days and does not want to resume this medication.  She asks to resume Paxil and this was agreed upon by Clinical research associatewriter.  She is currently taking 0.25 mg Klonopin at night and wants to wean self off by taking every other day, every third day and so on.      Plan agreed upon tonight is as follows:  1) Remain off Prozac  2) Restart Paxil at 5 mg daily for one week,  then can increase to 10 mg daily if tolerates.        Will re-evaluate in 2 weeks to see how patient is doing at 10 mg dose and increase to       20 mg if indicated at that time.  Informed patient the plan is to titrate up, as tolerated, to         lowest effective dose.    3) Continue Neurontin at 300 mg TID, with no increase at this time, and prefer to not increase in future.    4) Klonopin 0.25 mg qhs, space out as tolerates with goal to be off of Klonopin completely.         The sheer volume of in basket messages alone is one manifestation of the amount/significance of her anxiety.  Patient has appointment to establish and begin individual therapy this Wednesday.  Medication follow up with this provider is April 07, 2016.      Vernell MorgansSarah Hermela Hardt, MD  03/13/2016, 23:22

## 2016-03-15 ENCOUNTER — Ambulatory Visit (INDEPENDENT_AMBULATORY_CARE_PROVIDER_SITE_OTHER): Payer: Medicare Other | Admitting: Social Worker

## 2016-03-15 ENCOUNTER — Encounter (HOSPITAL_COMMUNITY): Payer: Self-pay | Admitting: Student in an Organized Health Care Education/Training Program

## 2016-03-16 ENCOUNTER — Other Ambulatory Visit (HOSPITAL_COMMUNITY): Payer: Self-pay | Admitting: Student in an Organized Health Care Education/Training Program

## 2016-03-16 MED ORDER — BUSPIRONE 7.5 MG TABLET
7.50 mg | ORAL_TABLET | Freq: Two times a day (BID) | ORAL | 0 refills | Status: DC
Start: 2016-03-16 — End: 2016-03-17

## 2016-03-16 MED ORDER — PAROXETINE 10 MG TABLET
10.0000 mg | ORAL_TABLET | Freq: Every day | ORAL | 0 refills | Status: DC
Start: 2016-03-16 — End: 2016-03-17

## 2016-03-16 NOTE — Telephone Encounter (Signed)
Patient and provider have been in communication via [employer] messages.  Please see for detail if needed.    Rx for Buspar 7.5 mg BID and Paxil 10 mg daily Erx to pharmacy.     Vernell MorgansSarah Annelies Coyt, MD  03/16/2016, 23:14

## 2016-03-17 ENCOUNTER — Other Ambulatory Visit (HOSPITAL_COMMUNITY): Payer: Self-pay | Admitting: Student in an Organized Health Care Education/Training Program

## 2016-03-17 MED ORDER — TRAZODONE 50 MG TABLET
ORAL_TABLET | ORAL | 0 refills | Status: DC
Start: 2016-03-17 — End: 2016-03-17

## 2016-03-17 MED ORDER — VENLAFAXINE ER 37.5 MG CAPSULE,EXTENDED RELEASE 24 HR
37.5000 mg | ORAL_CAPSULE | Freq: Every day | ORAL | 0 refills | Status: DC
Start: 2016-03-17 — End: 2016-03-19

## 2016-03-17 MED ORDER — TRAZODONE 50 MG TABLET
25.0000 mg | ORAL_TABLET | Freq: Every evening | ORAL | 0 refills | Status: DC
Start: 2016-03-17 — End: 2016-03-19

## 2016-03-19 ENCOUNTER — Other Ambulatory Visit (HOSPITAL_COMMUNITY): Payer: Self-pay | Admitting: Student in an Organized Health Care Education/Training Program

## 2016-03-19 MED ORDER — QUETIAPINE 50 MG TABLET
ORAL_TABLET | ORAL | 0 refills | Status: DC
Start: 2016-03-19 — End: 2016-04-07

## 2016-03-21 ENCOUNTER — Telehealth (HOSPITAL_COMMUNITY): Payer: Self-pay | Admitting: Psychiatry

## 2016-03-21 ENCOUNTER — Telehealth (HOSPITAL_COMMUNITY): Payer: Self-pay | Admitting: Student in an Organized Health Care Education/Training Program

## 2016-03-21 NOTE — Telephone Encounter (Signed)
-----   Message from Elizabeth Rayanne Witter, CLINICAL THERAPIST sent at 03/21/2016  1:22 PM EDT -----  Regarding: RE: transfer care   This is ok!    ----- Message -----     From: Juniel Groene Deonna, CASE MANAGER     Sent: 03/21/2016  12:52 PM       To: Elizabeth Rayanne Witter, CLINICAL THERAPIST  Subject: FW: transfer care                                Hey,   I am just wanting to make sure this transfer of care is okay before I schedule anything   Thank you   Dmario Russom   ----- Message -----     From: Cash, Sarah, MD     Sent: 03/21/2016  12:14 PM       To: [place] Duquette Deonna Lya Holben, CASE MANAGER, #  Subject: transfer care                                    Hi Mitchel Delduca,    Please transfer this patient's care to Dr. Al-Qawasmi.  She has requested transfer of care, and I have discussed this with Halima.  Dr. Al-Qawasmi is agreeable and has accepted her as a patient.     She will need an appointment within the next 3 weeks if possible.   (Recently started Seroquel)    Thank you!  Sarah Cash, MD  03/21/2016, 12:16

## 2016-03-21 NOTE — Telephone Encounter (Signed)
-----   Message from [facility], CLINICAL THERAPIST sent at 03/21/2016  1:22 PM EDT -----  Regarding: RE: transfer care   This is ok!    ----- Message -----     From: [place] JanskyHigdon, Soila Printup Deonna, CASE MANAGER     Sent: 03/21/2016  12:52 PM       To: [place] ModeElizabeth Rayanne Witter, CLINICAL THERAPIST  Subject: FW: transfer care                                Hey,   I am just wanting to make sure this transfer of care is okay before I schedule anything   Thank you   Zollie Scalelivia   ----- Message -----     From: Vernell Morgansash, Sarah, MD     Sent: 03/21/2016  12:14 PM       To: [place] Janskylivia Deonna Nyla Creason, CASE MANAGER, #  Subject: transfer care                                    Hi Peri Kreft,    Please transfer this patient's care to Dr. Othella BoyerAl-Qawasmi.  She has requested transfer of care, and I have discussed this with Halima.  Dr. Othella BoyerAl-Qawasmi is agreeable and has accepted her as a patient.     She will need an appointment within the next 3 weeks if possible.   (Recently started Seroquel)    Thank you!  Vernell MorgansSarah Cash, MD  03/21/2016, 12:16

## 2016-03-21 NOTE — Telephone Encounter (Signed)
Per Approval from Clinic Manager Case Manager contacted patient to transfer care from Dr. Delford Fieldash to Dr. Othella BoyerAl-Qawasmi. Case Manager scheduled patient on 04/07/2016 at 1:30pm with Dr. Othella BoyerAl-Qawasmi.     [place] Janskylivia Deonna Nykole Matos, CASE MANAGER  03/21/2016, 13:34

## 2016-03-27 ENCOUNTER — Encounter (HOSPITAL_COMMUNITY): Payer: Self-pay | Admitting: Psychiatry

## 2016-03-28 ENCOUNTER — Other Ambulatory Visit (HOSPITAL_COMMUNITY): Payer: Self-pay | Admitting: Student in an Organized Health Care Education/Training Program

## 2016-03-28 ENCOUNTER — Other Ambulatory Visit (HOSPITAL_COMMUNITY): Payer: Self-pay | Admitting: Registered Nurse

## 2016-03-28 ENCOUNTER — Encounter (HOSPITAL_COMMUNITY): Payer: Self-pay | Admitting: Student in an Organized Health Care Education/Training Program

## 2016-03-30 ENCOUNTER — Encounter (HOSPITAL_COMMUNITY): Payer: Self-pay | Admitting: Psychiatry

## 2016-04-01 ENCOUNTER — Ambulatory Visit (HOSPITAL_COMMUNITY): Payer: Self-pay | Admitting: Psychiatry

## 2016-04-01 MED ORDER — GABAPENTIN 300 MG CAPSULE
300.0000 mg | ORAL_CAPSULE | Freq: Three times a day (TID) | ORAL | 0 refills | Status: AC
Start: 2016-04-01 — End: 2016-05-01

## 2016-04-01 NOTE — Telephone Encounter (Signed)
Sent a prescription for Neurontin 300 mg tid to patient's pharmacy on file.    Carole BinningHalima Al-Qawasmi, MD  04/01/2016, 18:37

## 2016-04-07 ENCOUNTER — Encounter (HOSPITAL_COMMUNITY): Payer: Medicare Other

## 2016-04-07 ENCOUNTER — Ambulatory Visit (INDEPENDENT_AMBULATORY_CARE_PROVIDER_SITE_OTHER): Payer: Medicare Other | Admitting: Psychiatry

## 2016-04-07 DIAGNOSIS — F329 Major depressive disorder, single episode, unspecified: Secondary | ICD-10-CM

## 2016-04-07 DIAGNOSIS — F32A Depression, unspecified: Secondary | ICD-10-CM

## 2016-04-07 DIAGNOSIS — F419 Anxiety disorder, unspecified: Secondary | ICD-10-CM

## 2016-04-07 MED ORDER — ESCITALOPRAM 5 MG TABLET
5.0000 mg | ORAL_TABLET | Freq: Every day | ORAL | 1 refills | Status: DC
Start: 2016-04-07 — End: 2016-06-22

## 2016-04-07 NOTE — Progress Notes (Addendum)
Behavioral Medicine and Psychiatry   Outpatient Progress Note      Sandra Hancock   [id number]  1981-04-25    DOS: 04/07/16    Chief Complaint   Patient presents with    Anxiety    Depression        SUBJECTIVE:  Patient presents as a transfer of care from Dr. Delford Field for management of unspecified anxiety and depression. She says that she has been anxious for the majority of her life, but that this acutely worsened 1 month ago when her previously prescribed Klonopin was discontinued. She had been taking this medication since she was 69, and she says she has been having a difficult time since it was discontinued. She says she has been having trouble focusing, decreased sleep, irritability, and fatigue; discussed that withdrawal following chronic use of benzodiazepines can last for months, and that additional time off of the medication was necessary to see improvement in some of these symptoms. She was receptive to this education. She says that the [facility] that was started at her last appointment has been making her "irritable", and has not been helping her sleep. She currently feels that lack of sleep is her biggest problem, and is likely worsening her baseline anxiety. She has not increased her dose from 25 mg to 50 mg, because she was concerned that her syptoms would worsen. She says that she is normally very sensitive to medications, and attempts to keep the dosages of her medications low.  Past medication trials were reviewed; she has tried Buspar in the past, but says that this caused her to have headaches, and hydroxyzine has not been effective in managing her anxiety. She was on short courses of Depakote and Lamictal in the past, but says that she was not on them long enough to gauge effectiveness. She thinks that she has tried Inderal but is unsure if it helped her. She feels that Tegretol worked well for her in the past, but was unable to recall why this was discontinued. She took Lexapro when she was a child, and  says that it managed her mood and anxiety well; she discontinued the medication years ago because she did not feel that it was necessary any longer.  Discussed multiple medication options, including increasing her Neurontin, restarting Lexapro, or considering a medication she had not yet tried, and she elected to start Lexapro. She requested to be started on a dose lower than the recommended starting dose so she could monitor for side effects. She was also agreeable for referrals to individual therapy, and to insomnia groups through Sleep Medicine.  Discussed with patient that her diagnoses at this time are not clear, and that she would likely benefit from a repeated psychiatric evaluation. She is agreeable to returning in 3 weeks for an hour long appointment for further diagnostic clarification. She denies SI/HI/AVH and any other questions or concerns at this time      Past Social History-reviewed , no changes since last visit        OBJECTIVE:    Muscle strength and tone- normal  Gait and station - stable  MENTAL STATUS EXAM:  Appearance:  casually dressed, well groomed, appears actual age and no apparent distress  Behavior:  cooperative and eye contact  Fair  Motor/MS:  normal gait  Speech:  normal  Mood:  anxious  Affect:  constricted  Perception:  normal  Thought:  goal directed/coherent  Thought Content:  appropriate   Suicidal Ideation:  none.  Homicidal Ideation:  none  Level of Consciousness:  alert  Orientation:  grossly normal  Concentration:  fair  Judgement:  fair  Insight:  fair      MEDICATIONS:    Outpatient Medications Prior to Visit:  clonazePAM (KLONOPIN) 0.5 mg Oral Tablet Take 0.5 Tabs (0.25 mg total) by mouth Every night   gabapentin (NEURONTIN) 300 mg Oral Capsule Take 1 Cap (300 mg total) by mouth Three times a day for 30 days   QUEtiapine (SEROQUEL) 50 mg Oral Tablet Please take 1/2 tablet (25 mg) each night for 1 week, then increase to 1 tablet (50 mg) nightly.     No  facility-administered medications prior to visit.       ASSESSMENT:  Sandra Hancock is a 35 y.o. year old female presenting to clinic as a transfer of care from Dr. Delford Fieldash for management of unspecified anxiety and depressive disorders. Patient has a long history of anxiety, with a likely multifactorial etiology, as she has a significant trauma history, a history of substance abuse, and a history of poor relationships with her family while growing up.  In addition to this, Klonopin (which she had been taking since the age of 35), was discontinued about a month ago, so her anxiety has likely worsened secondary to this change. Patient was previously diagnosed with ADHD, but NPT completed in February 2017 did not substantiate this diagnosis. At this time the patient requires diagnostic clarification while attempts to manage her sleep and anxiety are attempted.    Patient was staffed by Dr. Norina Buzzardhar prior to discharge from clinic.    PLAN:    Unspecified anxiety disorder  -Beginning Lexapro 2.5 mg daily    -Patient advised that she may increase her dose to 5 mg daily after a few days if tolerating  -Continue Neurontin 300 mg tid  -Seroquel discontinued    Unspecified depressive disorder  -Beginning Lexapro 2.5 mg daily     -Patient advised that she may increase her dose to 5 mg daily after a few days if tolerating      Orders Placed This Encounter    escitalopram oxalate (LEXAPRO) 5 mg Oral Tablet       Laboratory Studies:  None     Therapy:  Patient agreeable to referral    Follow up:  3 weeks; scheduled for a full so diagnostic clarification can be attempted    Patient advised to call with any questions or concerns.     Carole BinningHalima Al-Qawasmi, MD 04/14/2016, 13:37       I saw and examined the patient.  I reviewed the resident's note.  I agree with the findings and plan of care as documented in the resident's note.  Any exceptions/additions are edited/noted.    Princess Bruinsomika Bowman Higbie M.D.  Adult, Geriatric Psychiatry, Assistant  Professor  [facility]  [address]  [place] [id number]

## 2016-04-10 ENCOUNTER — Ambulatory Visit (INDEPENDENT_AMBULATORY_CARE_PROVIDER_SITE_OTHER): Payer: Medicare Other | Admitting: Social Worker

## 2016-04-25 ENCOUNTER — Ambulatory Visit (HOSPITAL_COMMUNITY): Payer: Medicare Other | Admitting: Psychiatry

## 2016-04-28 ENCOUNTER — Ambulatory Visit (INDEPENDENT_AMBULATORY_CARE_PROVIDER_SITE_OTHER): Payer: Medicare Other | Admitting: Social Worker

## 2016-06-22 ENCOUNTER — Other Ambulatory Visit (HOSPITAL_COMMUNITY): Payer: Self-pay | Admitting: Psychiatry

## 2016-06-27 MED ORDER — ESCITALOPRAM 5 MG TABLET
5.0000 mg | ORAL_TABLET | Freq: Every day | ORAL | 2 refills | Status: AC
Start: 2016-06-27 — End: ?

## 2016-12-10 ENCOUNTER — Other Ambulatory Visit: Payer: Self-pay

## 2017-07-24 ENCOUNTER — Telehealth (HOSPITAL_COMMUNITY): Payer: Self-pay

## 2017-07-24 ENCOUNTER — Encounter (HOSPITAL_COMMUNITY): Payer: Self-pay

## 2017-07-24 NOTE — Telephone Encounter (Signed)
Attempted to contact patient via both numbers ion file. One was the incorrect number and the other was disconnected. Patient will need to call back in order to schedule.    Dub MikesBenjamin A Magnus Crescenzo, CASE MANAGER  07/24/2017, 15:10

## 2017-07-24 NOTE — Telephone Encounter (Signed)
-----   Message from Charna Archerarrie Melanie McMillen sent at 07/24/2017 11:27 AM EDT -----  Regarding: wants COAT  Patient called my line, wants to see "suboxone doctor"  Can you please follow up with her?    Thank you,  Shawna OrleansMelanie

## 2017-08-23 ENCOUNTER — Ambulatory Visit (HOSPITAL_COMMUNITY): Payer: Self-pay

## 2017-08-23 NOTE — Telephone Encounter (Signed)
Below message sent to Clinic Manager for assistance     Good Morning,   This Office is calling and saying that they have spoken with Medical Records and they are waiting for our provider to release this patients records. The patient was last seen on 04/07/2016 by Dr. Othella BoyerAl-Qawasmi.   I am unsure of who to forward this message too and if it needs to be forwarded to Dr. Othella BoyerAl-Qawasmi or straight to medical records? Would you be able to help me with this?     Thank you   Sandra LinesOlivia        Sandra Hancock Sandra Hancock, CASE MANAGER  08/23/2017, 11:43

## 2017-08-23 NOTE — Telephone Encounter (Signed)
-----   Message from [place] Nickelatherine Marissa Reigel sent at 08/23/2017 11:29 AM EDT -----  Shawna OrleansMelanie is calling from the [employer] [facility] [place] in [place] regarding a release of records. Release was initially sent on 07/11/17. Rosemary from Medical Records sent the release to be signed by one of the pts doctors on 07/20/17 and 08/22/17. Pts Records still have not been sent because Coy SaunasRosemary said a doctor has yet to sign the ROI. Please call to advise.    Thanks!

## 2019-08-23 ENCOUNTER — Other Ambulatory Visit: Payer: Self-pay
